# Patient Record
Sex: Female | Born: 1970
Health system: Southern US, Community
[De-identification: ages and names within clinical notes are randomized; demographics above are authoritative.]

## PROBLEM LIST (undated history)

## (undated) DIAGNOSIS — R0600 Dyspnea, unspecified: Secondary | ICD-10-CM

## (undated) DIAGNOSIS — F419 Anxiety disorder, unspecified: Secondary | ICD-10-CM

## (undated) HISTORY — PX: ANTERIOR CRUCIATE LIGAMENT REPAIR: SHX115

## (undated) HISTORY — PX: BREAST SURGERY: SHX581

## (undated) HISTORY — DX: Anxiety disorder, unspecified: F41.9

---

## 2011-01-16 ENCOUNTER — Other Ambulatory Visit: Payer: Self-pay | Admitting: Family Medicine

## 2011-01-17 ENCOUNTER — Other Ambulatory Visit: Payer: Self-pay | Admitting: Obstetrics and Gynecology

## 2011-01-17 DIAGNOSIS — N6489 Other specified disorders of breast: Secondary | ICD-10-CM

## 2011-01-23 ENCOUNTER — Ambulatory Visit
Admission: RE | Admit: 2011-01-23 | Discharge: 2011-01-23 | Disposition: A | Discharge status: Home / Self Care | Payer: Self-pay | Source: Ambulatory Visit | Attending: Obstetrics and Gynecology | Admitting: Obstetrics and Gynecology

## 2011-01-23 DIAGNOSIS — N6489 Other specified disorders of breast: Secondary | ICD-10-CM

## 2012-04-11 ENCOUNTER — Other Ambulatory Visit: Payer: Self-pay | Admitting: Obstetrics and Gynecology

## 2012-04-11 DIAGNOSIS — Z1231 Encounter for screening mammogram for malignant neoplasm of breast: Secondary | ICD-10-CM

## 2012-05-02 ENCOUNTER — Ambulatory Visit
Admission: RE | Admit: 2012-05-02 | Discharge: 2012-05-02 | Disposition: A | Discharge status: Home / Self Care | Payer: Self-pay | Source: Ambulatory Visit | Attending: Obstetrics and Gynecology | Admitting: Obstetrics and Gynecology

## 2012-05-02 DIAGNOSIS — Z1231 Encounter for screening mammogram for malignant neoplasm of breast: Secondary | ICD-10-CM

## 2013-06-05 ENCOUNTER — Other Ambulatory Visit: Payer: Self-pay

## 2013-06-05 DIAGNOSIS — Z1231 Encounter for screening mammogram for malignant neoplasm of breast: Secondary | ICD-10-CM

## 2013-07-06 ENCOUNTER — Ambulatory Visit
Admission: RE | Admit: 2013-07-06 | Discharge: 2013-07-06 | Disposition: A | Discharge status: Home / Self Care | Payer: BC Managed Care – PPO | Source: Ambulatory Visit

## 2013-07-06 DIAGNOSIS — Z1231 Encounter for screening mammogram for malignant neoplasm of breast: Secondary | ICD-10-CM

## 2014-06-09 ENCOUNTER — Other Ambulatory Visit: Payer: Self-pay

## 2014-06-09 DIAGNOSIS — Z1231 Encounter for screening mammogram for malignant neoplasm of breast: Secondary | ICD-10-CM

## 2014-07-09 ENCOUNTER — Ambulatory Visit
Admission: RE | Admit: 2014-07-09 | Discharge: 2014-07-09 | Disposition: A | Discharge status: Home / Self Care | Payer: Managed Care, Other (non HMO) | Source: Ambulatory Visit

## 2014-07-09 ENCOUNTER — Encounter (INDEPENDENT_AMBULATORY_CARE_PROVIDER_SITE_OTHER): Payer: Self-pay

## 2014-07-09 DIAGNOSIS — Z1231 Encounter for screening mammogram for malignant neoplasm of breast: Secondary | ICD-10-CM

## 2015-05-13 DIAGNOSIS — F419 Anxiety disorder, unspecified: Secondary | ICD-10-CM | POA: Insufficient documentation

## 2015-05-19 ENCOUNTER — Encounter: Payer: Self-pay | Admitting: Family Medicine

## 2015-05-19 ENCOUNTER — Ambulatory Visit (INDEPENDENT_AMBULATORY_CARE_PROVIDER_SITE_OTHER): Payer: Managed Care, Other (non HMO) | Admitting: Family Medicine

## 2015-05-19 VITALS — BP 110/78 | HR 91 | Temp 99.1°F | Ht 65.0 in | Wt 157.4 lb

## 2015-05-19 DIAGNOSIS — R1902 Left upper quadrant abdominal swelling, mass and lump: Secondary | ICD-10-CM

## 2015-05-19 DIAGNOSIS — B349 Viral infection, unspecified: Secondary | ICD-10-CM

## 2015-05-19 NOTE — Progress Notes (Signed)
BP 110/78 mmHg  Pulse 91  Temp(Src) 99.1 F (37.3 C)  Ht  (1.651 m)  Wt 157 lb 6.4 oz (71.396 kg)  BMI 26.19 kg/m2  SpO2 98%   Subjective:    Patient ID: Jenna James, female    DOB: 06-Jan-1971, 44 y.o.   MRN: [id number]  HPI: Jenna James is a 44 y.o. female  Chief Complaint  Patient presents with  . Mass    Left side and near ribs.   . Nasal Congestion  . Cough  . Ear Pain  . Adenopathy   She has a lump under her left breast, it started out really small and has been growing; she went to gynecologist for her yearly in December; he thought it was just a fatty tumor; however, she says that it has gotten bigger; feels it when bending forward or when stretching; feels pressure when eating a lot; no redness, no drainage; not pain when just sitting there; nothing similar for patient; mother and grandmother have had fatty tumors  She has been sick, declined to have flu testing done today; she was out sick all day yesterday; she got tired and body aches, maybe Monday night first symptoms, Tuesday morning was okay, then worse again Tuesday night; both ears are bothering her; lymph node swelled up on the left side; no sore throat; cough, wheezing with cough or forced expiration; back hurts  Quit running because of ankle pain  Relevant past medical, surgical, family and social history reviewed and updated as indicated. Interim medical history since our last visit reviewed. Allergies and medications reviewed and updated.  Review of Systems Per HPI unless specifically indicated above     Objective:    BP 110/78 mmHg  Pulse 91  Temp(Src) 99.1 F (37.3 C)  Ht  (1.651 m)  Wt 157 lb 6.4 oz (71.396 kg)  BMI 26.19 kg/m2  SpO2 98%  Wt Readings from Last 3 Encounters:  05/19/15 157 lb 6.4 oz (71.396 kg)  03/05/14 148 lb (67.132 kg)    Physical Exam  Constitutional: She appears well-developed and well-nourished.  Non-toxic appearance. She does not have a sickly appearance.  She does not appear ill. No distress.  Wearing a mask  HENT:  Right Ear: Hearing, external ear and ear canal normal. Tympanic membrane is injected (very mild). Tympanic membrane is not retracted. No middle ear effusion.  Left Ear: Hearing, external ear and ear canal normal. Tympanic membrane is injected (very mild). Tympanic membrane is not retracted.  No middle ear effusion.  Nose: Mucosal edema (very mild erythema and edema, slight swelling of right turbinates), rhinorrhea (scant cloudy rhinorrhea) and septal deviation (right side narrowed) present. Right sinus exhibits no maxillary sinus tenderness. Left sinus exhibits no maxillary sinus tenderness.  Mouth/Throat: Mucous membranes are normal. No oropharyngeal exudate, posterior oropharyngeal edema or posterior oropharyngeal erythema.  Eyes: EOM are normal. Right eye exhibits no exudate. Left eye exhibits no exudate. Right conjunctiva is not injected. Left conjunctiva is not injected. No scleral icterus.  Neck: No thyromegaly present.  Cardiovascular: Normal rate and regular rhythm.   Pulmonary/Chest: Effort normal and breath sounds normal. She has no wheezes. She has no rhonchi.  Abdominal: Soft. There is no hepatomegaly. There is no tenderness. There is no guarding.  Along the left upper quadrant over the ribs but below the left breast, there is an irregular firm area of swelling with overlying skin discoloration; no peau d'orange changes, just dusky discoloration  Lymphadenopathy:  Head (right side): No submandibular adenopathy present.       Head (left side): No submandibular adenopathy present.    She has cervical adenopathy.       Right cervical: No superficial cervical, no deep cervical and no posterior cervical adenopathy present.      Left cervical: Posterior cervical (shoddy) adenopathy present. No superficial cervical and no deep cervical adenopathy present.  Skin: She is not diaphoretic. No pallor.  Dark discoloration, almost  the color of a faint bruise, over the firm mass on the left upper quadrant of abdomen  Psychiatric: She has a normal mood and affect. Her behavior is normal.   No results found for this or any previous visit.    Assessment & Plan:   Problem List Items Addressed This Visit      Other   Abdominal wall mass of left upper quadrant - Primary    This does not feel like a lipoma and has overlying skin changes; will start with getting an ultrasound of the area and refer her to a surgeon      Relevant Orders   Ambulatory referral to General Surgery   US Misc Soft Tissue    Other Visit Diagnoses    Viral syndrome        should resolve on its own with symptomatic care; see AVS; if still sick in a week, consider mono or CMV; call if any problems       Follow up plan: No Follow-up on file.  Orders Placed This Encounter  Procedures  . US Misc Soft Tissue  . Ambulatory referral to General Surgery   An after-visit summary was printed and given to the patient at check-out.  Please see the patient instructions which may contain other information and recommendations beyond what is mentioned above in the assessment and plan.

## 2015-05-19 NOTE — Patient Instructions (Addendum)
Try vitamin C (orange juice if not diabetic or vitamin C tablets) and drink green tea to help your immune system during your illness Get plenty of rest and hydration If you feel worse and still sick in a week, this may be mono or CMV, and those are just treated symptomatically with rest and hydration We'll refer you to the general surgeon, and get an ultrasound If you have not heard anything from my staff in a week about any orders/referrals/studies from today, please contact us here to follow-up [phone] Consider getting a flu shot when you are feeling better in the next few weeks Return for a complete physical with Elnita MaxwellCheryl or the provider of your choice in the next few months

## 2015-05-19 NOTE — Assessment & Plan Note (Signed)
This does not feel like a lipoma and has overlying skin changes; will start with getting an ultrasound of the area and refer her to a surgeon

## 2015-05-24 ENCOUNTER — Encounter: Payer: Self-pay | Admitting: *Deleted

## 2015-05-26 ENCOUNTER — Telehealth: Payer: Self-pay | Admitting: Family Medicine

## 2015-05-26 DIAGNOSIS — R1902 Left upper quadrant abdominal swelling, mass and lump: Secondary | ICD-10-CM

## 2015-05-26 NOTE — Assessment & Plan Note (Signed)
New order for US per radiology

## 2015-05-26 NOTE — Telephone Encounter (Signed)
Please change order to Ultrasound limited abdomen per scheduling department

## 2015-05-31 ENCOUNTER — Other Ambulatory Visit: Payer: Self-pay

## 2015-05-31 DIAGNOSIS — Z1231 Encounter for screening mammogram for malignant neoplasm of breast: Secondary | ICD-10-CM

## 2015-06-02 ENCOUNTER — Ambulatory Visit: Payer: Self-pay | Admitting: General Surgery

## 2015-06-07 ENCOUNTER — Ambulatory Visit
Admission: RE | Admit: 2015-06-07 | Discharge: 2015-06-07 | Disposition: A | Discharge status: Home / Self Care | Payer: Managed Care, Other (non HMO) | Source: Ambulatory Visit | Attending: Family Medicine | Admitting: Family Medicine

## 2015-06-07 ENCOUNTER — Other Ambulatory Visit: Payer: Self-pay | Admitting: Family Medicine

## 2015-06-07 ENCOUNTER — Encounter (INDEPENDENT_AMBULATORY_CARE_PROVIDER_SITE_OTHER): Payer: Self-pay

## 2015-06-07 ENCOUNTER — Ambulatory Visit: Payer: Managed Care, Other (non HMO)

## 2015-06-07 DIAGNOSIS — R1902 Left upper quadrant abdominal swelling, mass and lump: Secondary | ICD-10-CM

## 2015-06-08 ENCOUNTER — Telehealth: Payer: Self-pay | Admitting: Family Medicine

## 2015-06-08 NOTE — Telephone Encounter (Signed)
Please let her know that her US just showed a pocket of fat- nothing to worry about!

## 2015-06-08 NOTE — Telephone Encounter (Signed)
Patient notified

## 2015-06-13 ENCOUNTER — Ambulatory Visit (INDEPENDENT_AMBULATORY_CARE_PROVIDER_SITE_OTHER): Payer: Managed Care, Other (non HMO) | Admitting: General Surgery

## 2015-06-13 ENCOUNTER — Encounter: Payer: Self-pay | Admitting: General Surgery

## 2015-06-13 VITALS — BP 116/68 | HR 78 | Resp 12 | Ht 65.0 in | Wt 171.0 lb

## 2015-06-13 DIAGNOSIS — R1902 Left upper quadrant abdominal swelling, mass and lump: Secondary | ICD-10-CM

## 2015-06-13 NOTE — Progress Notes (Signed)
Patient ID: Jenna James, female   DOB: October 29, 1970, 44 y.o.   MRN: [id number]  Chief Complaint  Patient presents with  . Other    Lump on ribs    HPI Jenna James is a 44 y.o. female here today for a evaluation of an abdomen wall mass in her upper left quadrant . Patient noticed this about a year ago, she states the area has got bigger.Tender to touch. The patient reports that the area began as the size about that of the tip of her tongue and is now the size of her hand. She describes early satiety with meals. No nausea, vomiting, change in bowel habits.Ultrasound done on 06/07/15.  HPI  Past Medical History  Diagnosis Date  . Anxiety     Past Surgical History  Procedure Laterality Date  . Anterior cruciate ligament repair Left     left knee    Family History  Problem Relation Age of Onset  . Breast cancer Maternal Grandmother   . Colon cancer Maternal Aunt     Social History Social History  Substance Use Topics  . Smoking status: Never Smoker   . Smokeless tobacco: Never Used  . Alcohol Use: 0.6 - 1.2 oz/week    1-2 Standard drinks or equivalent per week    Allergies  Allergen Reactions  . Celexa [Citalopram]     Current Outpatient Prescriptions  Medication Sig Dispense Refill  . clonazePAM (KLONOPIN) 0.5 MG tablet Take 0.5 mg by mouth at bedtime as needed for anxiety.    . diclofenac (VOLTAREN) 75 MG EC tablet Take 75 mg by mouth 2 (two) times daily.     No current facility-administered medications for this visit.    Review of Systems Review of Systems  Constitutional: Negative.   HENT: Negative.   Eyes: Negative.   Respiratory: Negative.   Endocrine: Negative.   Genitourinary: Negative.     Blood pressure 116/68, pulse 78, resp. rate 12, height _0  (1.651 m), weight 171 lb (77.565 kg).  Physical Exam Physical Exam  Constitutional: She is oriented to person, place, and time. She appears well-developed and well-nourished.  Eyes: Conjunctivae are normal.  No scleral icterus.  Neck: Neck supple.  Cardiovascular: Normal rate, regular rhythm and normal heart sounds.   Pulmonary/Chest: Effort normal and breath sounds normal.    Abdominal: Soft. Normal appearance and bowel sounds are normal.  Brownish color area at mcl 4 cm above the costal margins.   Lymphadenopathy:    She has no cervical adenopathy.  Neurological: She is alert and oriented to person, place, and time.  Skin: Skin is warm and dry.    Data Reviewed Ultrasound of the soft tissues of the upper abdomen completed 06/07/2015 were reviewed. No distinct ultrasound abnormality.  Assessment    Abnormal exam of the upper abdominal wall, history early satiety.     Plan    It's difficult to correlate the abdominal wall findings with the report of early satiety. The patient reports increased symptoms 1 bending over to put on socks or tie her shoes suggesting a fullness not entirely accounted for by the modest soft tissue thickening on the anterior abdominal wall. A CT scan has been recommended to help clarify if a mass or inflammatory processes present. The patient is aware that I will review the films prior to calling the report.      Patient is scheduled for a CT at [facility] on 06/17/15 at 3:30 pm. She will pick up a  prep kit today. She is to arrive at 3:15 pm and have nothing to eat or drink for 4 hours prior. Patient is aware of date, time, and instructions.   PCP:  Fleet Contras 06/13/2015, 8:29 PM

## 2015-06-13 NOTE — Patient Instructions (Addendum)
Follow up appointment to be announced.  Patient is scheduled for a CT at [facility] on 06/17/15 at 3:30 pm. She will pick up a prep kit today. She is to arrive at 3:15 pm and have nothing to eat or drink for 4 hours prior. Patient is aware of date, time, and instructions.  

## 2015-06-14 ENCOUNTER — Telehealth: Payer: Self-pay | Admitting: *Deleted

## 2015-06-14 NOTE — Telephone Encounter (Signed)
Patient called to state she wants her CT done with [facility] [place], which it would be cheaper with her insurance. Needs an order sent for CT. [facility] [phone]/option 1

## 2015-06-15 ENCOUNTER — Other Ambulatory Visit: Payer: Self-pay

## 2015-06-15 DIAGNOSIS — R1902 Left upper quadrant abdominal swelling, mass and lump: Secondary | ICD-10-CM

## 2015-06-15 NOTE — Telephone Encounter (Signed)
Spoke with [facility] and the order and patient information has been sent to them. They will contact the patient to schedule this. I let the patient know to contact us once this is scheduled so we may make sure she has authorization with her insurance. Patient understands.

## 2015-06-17 ENCOUNTER — Ambulatory Visit: Payer: Managed Care, Other (non HMO)

## 2015-06-20 ENCOUNTER — Telehealth: Payer: Self-pay | Admitting: Family Medicine

## 2015-06-20 NOTE — Telephone Encounter (Signed)
I spoke with patient, she is seeing surgeon, has CT scan coming up; I explained I just wanted to make sure US and issue were being managed; I was out of town when her results came back, so just following up and being thorough

## 2015-06-22 ENCOUNTER — Telehealth: Payer: Self-pay | Admitting: General Surgery

## 2015-06-22 ENCOUNTER — Telehealth: Payer: Self-pay | Admitting: *Deleted

## 2015-06-22 NOTE — Telephone Encounter (Signed)
-----   Message from Earline MayotteJeffrey W Byrnett, MD sent at 06/22/2015  4:28 PM EST ----- CT was done at an outside facility. Report only notes prominence of the left hepatic lobe. Anatomic variant. No abnormality of the abdominal wall reported.  We'll be glad to review the films if the patient has a CD sent for review.

## 2015-06-22 NOTE — Telephone Encounter (Signed)
PT CALLED & WOULD LIKE FOR YOU TO CALL HER C# ONCE YOU GET HER CT RESULTS

## 2015-06-27 NOTE — Telephone Encounter (Signed)
Patient called back on Wednesday afternoon and left a message with the answering service returning Marsha's call.   An attempt was made to reach patient today but she did not answer. Message was left for patient to call the office.

## 2015-06-27 NOTE — Telephone Encounter (Signed)
Patient called back and was notified as instructed.   This patient states she does have a copy of the CD.

## 2015-06-28 ENCOUNTER — Encounter: Payer: Self-pay | Admitting: General Surgery

## 2015-06-28 ENCOUNTER — Telehealth: Payer: Self-pay

## 2015-06-28 NOTE — Telephone Encounter (Signed)
Notified patient as instructed, discussed follow-up appointments, patient agrees. Will follow up here with Dr Lemar LivingsByrnett on 07/05/15 at 4:00 pm.

## 2015-06-28 NOTE — Telephone Encounter (Signed)
-----   Message from Earline MayotteJeffrey W Byrnett, MD sent at 06/28/2015 11:13 AM EST ----- Please notify patient I reviewed her CT. I would like her to return for a f/u exam.  Thanks.

## 2015-06-28 NOTE — Progress Notes (Addendum)
06/22/2015 CT scan [facility] at [facility] was reviewed.  The study was completed to assess the patient's report of a mass effect in the left upper quadrant. Previous ultrasound had been reported as negative, with the suggestion that the mass effect could be secondary to a lipoma-like fatty deposit. CT report suggested prominence of the left hepatic lobe.  On review of the films left hepatic lobe does extend all the way to the splenic hilum. This is below the rib cage and would be unlikely to be sensed by palpation. There is no prominence of the adipose tissue on the left lower chest/upper abdomen when compared to the right side.  There seems to be asymmetry in the muscles at this level accounting for the reported prominence, although a distinct mass effect is not seen. Neither coronal nor sagittal sections were submitted, only axial images available for review.  The patient will be asked to return for reassessment and possible repeat ultrasound with focus on the muscles rounded in the adipose tissue.

## 2015-06-29 ENCOUNTER — Telehealth: Payer: Self-pay

## 2015-06-29 DIAGNOSIS — Z0000 Encounter for general adult medical examination without abnormal findings: Secondary | ICD-10-CM

## 2015-06-29 DIAGNOSIS — Z205 Contact with and (suspected) exposure to viral hepatitis: Secondary | ICD-10-CM

## 2015-06-29 NOTE — Telephone Encounter (Signed)
Patient called and stated that we referred her to general surgery, they did an u/s and sent her for a CT and now they want to see her back to discuss the results. She said he mentioned he suspected she has inflammation of the liver. She is worried because her husband is from [place] and gave blood while he was there. They told him that he had to stop because he had Hep C Antibiodies or Antigens. She wants to know if this could possibly be transmitted to her and causing her issues?

## 2015-06-30 DIAGNOSIS — Z0000 Encounter for general adult medical examination without abnormal findings: Secondary | ICD-10-CM | POA: Insufficient documentation

## 2015-06-30 DIAGNOSIS — Z205 Contact with and (suspected) exposure to viral hepatitis: Secondary | ICD-10-CM | POA: Insufficient documentation

## 2015-06-30 NOTE — Telephone Encounter (Signed)
I reviewed the note below Her husband moved from [place] She wondered if getting labs would be a good idea We'll get labs; she can have them done here OR at Dr. Rutherford NailByrnett's office or other draw station; she'll check insurance to see if co-pay Will start with very basic (CMET, hep panel) and other routine screening labs Further labs based on first set of tests

## 2015-07-05 ENCOUNTER — Other Ambulatory Visit: Payer: Managed Care, Other (non HMO)

## 2015-07-05 ENCOUNTER — Ambulatory Visit (INDEPENDENT_AMBULATORY_CARE_PROVIDER_SITE_OTHER): Payer: Managed Care, Other (non HMO) | Admitting: General Surgery

## 2015-07-05 ENCOUNTER — Encounter: Payer: Self-pay | Admitting: General Surgery

## 2015-07-05 VITALS — BP 120/70 | HR 72 | Resp 12 | Ht 65.0 in | Wt 159.0 lb

## 2015-07-05 DIAGNOSIS — R1902 Left upper quadrant abdominal swelling, mass and lump: Secondary | ICD-10-CM

## 2015-07-05 NOTE — Progress Notes (Signed)
Patient ID: Jenna James, female   DOB: 1971-05-16, 44 y.o.   MRN: [id number]  Chief Complaint  Patient presents with  . Follow-up    abdominal wasll mass results    HPI Jenna James is a 44 y.o. female here today abdominal wasll mass and CT results that was completed on 06/15/15. She states the pain is still the same, comes and goes. After her last viist the pain was worse for 2-3 days. She does described the pain as a mild discomfort but some days it may throb. She does appreciate the pain when she bends down to tie her shoes.  The patient thought that the underwire from her bra was traumatizing this area and when she assumed the posterior that she uses sitting at the computer desk the areas some motion of the broad down to this area, but this is unlikely the sole source. She does admit to tripping and falling on the right upper abdomen about 6 years ago. Since that time she's had intermittent right-sided discomfort, which was not mentioned during her last visit. This is unchanged over several years.   Minimal joint discomfort but she is riding a mountain bike.  She runs about 2 miles a day.  I person reviewed the above history.  HPI  Past Medical History  Diagnosis Date  . Anxiety     Past Surgical History  Procedure Laterality Date  . Anterior cruciate ligament repair Left     left knee    Family History  Problem Relation Age of Onset  . Breast cancer Maternal Grandmother   . Colon cancer Maternal Aunt     Social History Social History  Substance Use Topics  . Smoking status: Never Smoker   . Smokeless tobacco: Never Used  . Alcohol Use: 0.6 - 1.2 oz/week    1-2 Standard drinks or equivalent per week    Allergies  Allergen Reactions  . Celexa [Citalopram]     Current Outpatient Prescriptions  Medication Sig Dispense Refill  . clonazePAM (KLONOPIN) 0.5 MG tablet Take 0.5 mg by mouth at bedtime as needed for anxiety.     No current facility-administered  medications for this visit.    Review of Systems Review of Systems  Constitutional: Negative.   Respiratory: Negative.   Cardiovascular: Negative.   Gastrointestinal: Positive for abdominal pain. Negative for nausea, vomiting, diarrhea and constipation.    Blood pressure 120/70, pulse 72, resp. rate 12, height 5\' 5"  (1.651 m), weight 159 lb (72.122 kg).  Physical Exam Physical Exam  Constitutional: She is oriented to person, place, and time. She appears well-developed and well-nourished.  HENT:  Mouth/Throat: Oropharynx is clear and moist.  Eyes: Conjunctivae are normal. No scleral icterus.  Neck: Neck supple.  Cardiovascular: Normal rate, regular rhythm and normal heart sounds.   Pulmonary/Chest: Effort normal and breath sounds normal.    Abdominal: Normal appearance.  9 x 15 thickening center of right rib cage at axillary line  Lymphadenopathy:    She has no cervical adenopathy.  Neurological: She is alert and oriented to person, place, and time.  Skin: Skin is warm and dry.  Psychiatric: Her behavior is normal.    Data Reviewed Outside CT from [facility] dated 06/15/2015 reviewed. They described the left lobe of liver extending to the splenic hilum. This would not be a palpable finding nor doesn't account for the clinically evident changes in the skin. Independent review of the films suggested some asymmetric volume of the intercostal muscles.  Ultrasound  examination of the skin involving the lower chest wall was completed due to the asymmetric exam. There is slight prominence of the subcutaneous tissue and more pronounced prominence of the dermis on the left lower chest wall where the area of clinical thickening and skin discoloration is evident. The skin measures up to 0.32 cm in this area, below this where no visible disc abnormality is appreciated by visual inspection or palpation this only measures 0.25 cm. Node clearly identifiable mass in the subtenon's tissue is  appreciated. No vascular abnormality.  Examination of the contralateral lower chest wall skin shows a thickness of 0.26 cm, similar to that in the uninvolved skin on the left side of the abdomen. This study does not clearly elucidate a process accounting for the visible findings.  Assessment    Atypical skin of the chest wall, unlikely to be purely related to trauma from her underwire bra.     Plan    This may be an dermal manifestation of a systemic process, and before biopsy is undertaken her recommended formal evaluation with dermatology would be appropriate.   Her daughter has gone to [facility], and she was encouraged to seek evaluation through their office of this process. With her permission, copy of today's notes will be forwarded to them.     Referral to dermatology.  PCP:  Thalia Bloodgood 07/06/2015, 4:08 PM

## 2015-07-05 NOTE — Patient Instructions (Addendum)
The patient is aware to call back for any questions or concerns. Dermatology appointment

## 2015-07-11 ENCOUNTER — Ambulatory Visit
Admission: RE | Admit: 2015-07-11 | Discharge: 2015-07-11 | Disposition: A | Discharge status: Home / Self Care | Payer: Managed Care, Other (non HMO) | Source: Ambulatory Visit

## 2015-07-11 DIAGNOSIS — Z1231 Encounter for screening mammogram for malignant neoplasm of breast: Secondary | ICD-10-CM

## 2015-07-14 ENCOUNTER — Other Ambulatory Visit: Payer: Self-pay | Admitting: Obstetrics and Gynecology

## 2015-07-14 DIAGNOSIS — R928 Other abnormal and inconclusive findings on diagnostic imaging of breast: Secondary | ICD-10-CM

## 2015-08-11 ENCOUNTER — Ambulatory Visit
Admission: RE | Admit: 2015-08-11 | Discharge: 2015-08-11 | Disposition: A | Discharge status: Home / Self Care | Payer: Managed Care, Other (non HMO) | Source: Ambulatory Visit | Attending: Obstetrics and Gynecology | Admitting: Obstetrics and Gynecology

## 2015-08-11 DIAGNOSIS — R928 Other abnormal and inconclusive findings on diagnostic imaging of breast: Secondary | ICD-10-CM

## 2015-09-19 ENCOUNTER — Other Ambulatory Visit: Payer: Self-pay | Admitting: Unknown Physician Specialty

## 2015-09-26 ENCOUNTER — Other Ambulatory Visit: Payer: Self-pay

## 2015-09-26 NOTE — Telephone Encounter (Signed)
Routing to provider. She'd like a refill on Clonazepam, she states she had refills but they expire before she uses them.

## 2015-09-27 NOTE — Telephone Encounter (Signed)
OK to wait for ML due to PRN med

## 2015-09-28 NOTE — Telephone Encounter (Signed)
Please let Jalesia Loudenslager know that I'd like to see patient for an appointment here in the office for:  Requesting a controlled substance Please schedule a visit with me  in the next: few weeks Thank you, Dr. Sherie Don

## 2015-09-29 NOTE — Telephone Encounter (Signed)
Called patient but no answer therefore left a vm to call us back and schedule a f/u for her controlled substance, thanks.

## 2015-10-06 ENCOUNTER — Encounter: Payer: Self-pay | Admitting: Family Medicine

## 2015-10-06 NOTE — Telephone Encounter (Signed)
Sent letter home 10/06/15 °

## 2015-10-17 ENCOUNTER — Telehealth: Payer: Self-pay | Admitting: Family Medicine

## 2015-10-17 NOTE — Telephone Encounter (Signed)
Left detailed message to come in for fasting labs.

## 2015-10-17 NOTE — Telephone Encounter (Signed)
Please remind patient she has outstanding labs to be drawn; ordered in December; thank you

## 2016-03-02 ENCOUNTER — Other Ambulatory Visit: Payer: Self-pay | Admitting: Obstetrics and Gynecology

## 2016-03-02 DIAGNOSIS — N6310 Unspecified lump in the right breast, unspecified quadrant: Secondary | ICD-10-CM

## 2016-03-12 ENCOUNTER — Ambulatory Visit
Admission: RE | Admit: 2016-03-12 | Discharge: 2016-03-12 | Disposition: A | Discharge status: Home / Self Care | Payer: Managed Care, Other (non HMO) | Source: Ambulatory Visit | Attending: Obstetrics and Gynecology | Admitting: Obstetrics and Gynecology

## 2016-03-12 DIAGNOSIS — N6310 Unspecified lump in the right breast, unspecified quadrant: Secondary | ICD-10-CM

## 2016-03-16 ENCOUNTER — Other Ambulatory Visit: Payer: Self-pay | Admitting: Obstetrics and Gynecology

## 2016-03-16 DIAGNOSIS — N63 Unspecified lump in unspecified breast: Secondary | ICD-10-CM

## 2016-03-21 ENCOUNTER — Other Ambulatory Visit: Payer: Self-pay | Admitting: Obstetrics and Gynecology

## 2016-03-21 DIAGNOSIS — N63 Unspecified lump in unspecified breast: Secondary | ICD-10-CM

## 2016-04-12 ENCOUNTER — Encounter: Payer: Self-pay | Admitting: Family Medicine

## 2016-04-12 DIAGNOSIS — S8262XA Displaced fracture of lateral malleolus of left fibula, initial encounter for closed fracture: Secondary | ICD-10-CM | POA: Insufficient documentation

## 2016-09-13 ENCOUNTER — Ambulatory Visit
Admission: RE | Admit: 2016-09-13 | Discharge: 2016-09-13 | Disposition: A | Discharge status: Home / Self Care | Payer: Managed Care, Other (non HMO) | Source: Ambulatory Visit | Attending: Obstetrics and Gynecology | Admitting: Obstetrics and Gynecology

## 2016-09-13 DIAGNOSIS — N63 Unspecified lump in unspecified breast: Secondary | ICD-10-CM

## 2016-10-11 ENCOUNTER — Other Ambulatory Visit: Payer: Managed Care, Other (non HMO)

## 2016-10-11 ENCOUNTER — Ambulatory Visit: Payer: Managed Care, Other (non HMO)

## 2016-11-27 ENCOUNTER — Other Ambulatory Visit: Payer: Self-pay

## 2016-11-27 ENCOUNTER — Other Ambulatory Visit: Payer: Self-pay | Admitting: Unknown Physician Specialty

## 2016-11-27 NOTE — Telephone Encounter (Signed)
Patient has not been seen in over a year; rx request denied

## 2016-12-13 ENCOUNTER — Telehealth: Payer: Self-pay | Admitting: Unknown Physician Specialty

## 2016-12-13 NOTE — Telephone Encounter (Signed)
Printed and placed at front desk. Pt only has TD, and Tdaps on file here however.

## 2016-12-13 NOTE — Telephone Encounter (Signed)
Called and left a voicemail on patient's phone that we have her shot records ready.

## 2016-12-13 NOTE — Telephone Encounter (Signed)
Patient called to request a copy of her shot records because she is going out of town tomorrow 12/15/2015. Patient asked if she could receive a call when they are ready to  Come in and pick them up.  Please Advise.  Thank you

## 2016-12-14 ENCOUNTER — Telehealth: Payer: Self-pay | Admitting: Unknown Physician Specialty

## 2016-12-14 NOTE — Telephone Encounter (Signed)
error 

## 2016-12-14 NOTE — Telephone Encounter (Signed)
Patient stopped by and picked up shot records that showed available shot history.

## 2016-12-17 ENCOUNTER — Telehealth: Payer: Self-pay | Admitting: Family Medicine

## 2016-12-17 NOTE — Telephone Encounter (Signed)
Patient calling to get doctors orders for a travel vaccine. Patient states that she is going to [place]. Patient would also like for travel vaccine to be faxed to her home phone number at [phone].  Please Advise.  Thank you

## 2016-12-18 NOTE — Telephone Encounter (Signed)
We do not handle travel vaccines. Pt will need to contact health dept and/or a travel clinic such as [facility].

## 2016-12-18 NOTE — Telephone Encounter (Signed)
Referral not needed.

## 2017-01-21 ENCOUNTER — Ambulatory Visit (INDEPENDENT_AMBULATORY_CARE_PROVIDER_SITE_OTHER): Payer: Managed Care, Other (non HMO)

## 2017-01-21 DIAGNOSIS — Z23 Encounter for immunization: Secondary | ICD-10-CM

## 2017-03-13 ENCOUNTER — Encounter: Payer: Self-pay | Admitting: Family Medicine

## 2017-03-13 ENCOUNTER — Ambulatory Visit (INDEPENDENT_AMBULATORY_CARE_PROVIDER_SITE_OTHER): Payer: Managed Care, Other (non HMO) | Admitting: Family Medicine

## 2017-03-13 VITALS — BP 116/74 | HR 61 | Wt 147.0 lb

## 2017-03-13 DIAGNOSIS — J392 Other diseases of pharynx: Secondary | ICD-10-CM

## 2017-03-13 DIAGNOSIS — R42 Dizziness and giddiness: Secondary | ICD-10-CM

## 2017-03-13 DIAGNOSIS — R221 Localized swelling, mass and lump, neck: Secondary | ICD-10-CM

## 2017-03-13 DIAGNOSIS — F419 Anxiety disorder, unspecified: Secondary | ICD-10-CM | POA: Diagnosis not present

## 2017-03-13 MED ORDER — MECLIZINE HCL 25 MG PO TABS: 25.0000 mg | ORAL_TABLET | Freq: Three times a day (TID) | ORAL | 0 refills | Status: DC | PRN

## 2017-03-13 MED ORDER — CLONAZEPAM 0.5 MG PO TABS: 0.5000 mg | ORAL_TABLET | Freq: Every evening | ORAL | 0 refills | Status: DC | PRN

## 2017-03-13 NOTE — Patient Instructions (Addendum)
Meclizine as need for dizziness.   How to Perform the Epley Maneuver The Epley maneuver is an exercise that relieves symptoms of vertigo. Vertigo is the feeling that you or your surroundings are moving when they are not. When you feel vertigo, you may feel like the room is spinning and have trouble walking. Dizziness is a little different than vertigo. When you are dizzy, you may feel unsteady or light-headed. You can do this maneuver at home whenever you have symptoms of vertigo. You can do it up to 3 times a day until your symptoms go away. Even though the Epley maneuver may relieve your vertigo for a few weeks, it is possible that your symptoms will return. This maneuver relieves vertigo, but it does not relieve dizziness. What are the risks? If it is done correctly, the Epley maneuver is considered safe. Sometimes it can lead to dizziness or nausea that goes away after a short time. If you develop other symptoms, such as changes in vision, weakness, or numbness, stop doing the maneuver and call your health care provider. How to perform the Epley maneuver 1. Sit on the edge of a bed or table with your back straight and your legs extended or hanging over the edge of the bed or table. 2. Turn your head halfway toward the affected ear or side. 3. Lie backward quickly with your head turned until you are lying flat on your back. You may want to position a pillow under your shoulders. 4. Hold this position for 30 seconds. You may experience an attack of vertigo. This is normal. 5. Turn your head to the opposite direction until your unaffected ear is facing the floor. 6. Hold this position for 30 seconds. You may experience an attack of vertigo. This is normal. Hold this position until the vertigo stops. 7. Turn your whole body to the same side as your head. Hold for another 30 seconds. 8. Sit back up. You can repeat this exercise up to 3 times a day. Follow these instructions at home:  After doing  the Epley maneuver, you can return to your normal activities.  Ask your health care provider if there is anything you should do at home to prevent vertigo. He or she may recommend that you: ? Keep your head raised (elevated) with two or more pillows while you sleep. ? Do not sleep on the side of your affected ear. ? Get up slowly from bed. ? Avoid sudden movements during the day. ? Avoid extreme head movement, like looking up or bending over. Contact a health care provider if:  Your vertigo gets worse.  You have other symptoms, including: ? Nausea. ? Vomiting. ? Headache. Get help right away if:  You have vision changes.  You have a severe or worsening headache or neck pain.  You cannot stop vomiting.  You have new numbness or weakness in any part of your body. Summary  Vertigo is the feeling that you or your surroundings are moving when they are not.  The Epley maneuver is an exercise that relieves symptoms of vertigo.  If the Epley maneuver is done correctly, it is considered safe. You can do it up to 3 times a day. This information is not intended to replace advice given to you by your health care provider. Make sure you discuss any questions you have with your health care provider. Document Released: 07/21/2013 Document Revised: 06/05/2016 Document Reviewed: 06/05/2016 Elsevier Interactive Patient Education  2017 [employer]

## 2017-03-13 NOTE — Assessment & Plan Note (Signed)
Klonopin refilled today, continue taking 1/4 tab QHS prn. Precautions reviewed.

## 2017-03-13 NOTE — Progress Notes (Signed)
BP 116/74   Pulse 61   Wt 147 lb (66.7 kg)   SpO2 97%   BMI 24.46 kg/m    Subjective:    Patient ID: Jenna James, female    DOB: 01-Jun-1971, 46 y.o.   MRN: [id number]  HPI: Jenna James is a 46 y.o. female  Chief Complaint  Patient presents with  . Dizziness    started Sunday, feels off balance especially if she moves her head.  . Mass    white nodule in the back of her throat, has gotten bigger over the last year  . Medication Refill    she needs a refill on Klonopin.   Patient presents with several days of short episodes of dizziness that seem to be brought on by moving her head. Sxs last several seconds, then dissipate. Denies N/V, syncope, CP, palpitations, diaphoresis. Drinking plenty of water.   Needing klonopin refill for her anxiety. Taking 1/4 pill about 3/week, has been taking a bit more lately than she usually does due to work and personal life stress. Last refilled with Dr. Hyacinth Meeker when she broke her ankle last year. Works well, no side effects noted.   Also has been monitoring a small white lump near her tonsils. Maybe a small amount of growth over the past year. No pain, dysphagia, weight loss, fevers.   Sees GYN for annual physical. UTD.   Past Medical History:  Diagnosis Date  . Anxiety    Social History   Social History  . Marital status: Married    Spouse name: N/A  . Number of children: N/A  . Years of education: N/A   Occupational History  . Not on file.   Social History Main Topics  . Smoking status: Never Smoker  . Smokeless tobacco: Never Used  . Alcohol use 0.6 - 1.2 oz/week    1 - 2 Standard drinks or equivalent per week  . Drug use: No  . Sexual activity: Not on file   Other Topics Concern  . Not on file   Social History Narrative  . No narrative on file   Relevant past medical, surgical, family and social history reviewed and updated as indicated. Interim medical history since our last visit reviewed. Allergies and medications  reviewed and updated.  Review of Systems  Constitutional: Negative.   HENT: Negative.   Respiratory: Negative.   Cardiovascular: Negative.   Gastrointestinal: Negative.   Musculoskeletal: Negative.   Neurological: Positive for dizziness.  Psychiatric/Behavioral: Negative.    Per HPI unless specifically indicated above     Objective:    BP 116/74   Pulse 61   Wt 147 lb (66.7 kg)   SpO2 97%   BMI 24.46 kg/m   Wt Readings from Last 3 Encounters:  03/13/17 147 lb (66.7 kg)  07/05/15 159 lb (72.1 kg)  06/13/15 171 lb (77.6 kg)    Physical Exam  Constitutional: She is oriented to person, place, and time. She appears well-developed and well-nourished. No distress.  HENT:  Head: Atraumatic.  Right Ear: External ear normal.  Left Ear: External ear normal.  Nose: Nose normal.  Mouth/Throat: Oropharynx is clear and moist.  Eyes: Pupils are equal, round, and reactive to light. Conjunctivae are normal.  Neck: Normal range of motion. Neck supple.  Cardiovascular: Normal rate and normal heart sounds.   Pulmonary/Chest: Effort normal and breath sounds normal.  Musculoskeletal: Normal range of motion.  Neurological: She is alert and oriented to person, place, and time. No cranial nerve  deficit.  Skin: Skin is warm and dry.  Psychiatric: She has a normal mood and affect. Her behavior is normal.  Nursing note and vitals reviewed.  No results found for this or any previous visit.    Assessment & Plan:   Problem List Items Addressed This Visit      Other   Anxiety    Klonopin refilled today, continue taking 1/4 tab QHS prn. Precautions reviewed.        Other Visit Diagnoses    Dizziness    -  Primary   Suspect vertigo given positional nature. Will treat with meclizine prn and epley maneuvers. F/u if no improvement   Mass of throat       Small pale lump in posterior oropharynx. Not seeming to grow or change, non-tender. F/u with ENT if noticing changes       Follow up  plan: Return if symptoms worsen or fail to improve.

## 2017-05-20 ENCOUNTER — Other Ambulatory Visit: Payer: Self-pay | Admitting: Obstetrics and Gynecology

## 2017-05-20 DIAGNOSIS — Z1231 Encounter for screening mammogram for malignant neoplasm of breast: Secondary | ICD-10-CM

## 2017-06-12 ENCOUNTER — Other Ambulatory Visit: Payer: Self-pay | Admitting: Family Medicine

## 2017-06-12 NOTE — Telephone Encounter (Signed)
Review for refill. 

## 2017-06-18 ENCOUNTER — Ambulatory Visit: Payer: Self-pay | Admitting: *Deleted

## 2017-06-18 NOTE — Telephone Encounter (Signed)
   Reason for Disposition . Ear congestion  Answer Assessment - Initial Assessment Questions 1. LOCATION: "Which ear is involved?"       Both ears 2. SENSATION: "Describe how the ear feels."      Popping sensation, sharp pain at times 3. ONSET:  "When did the ear symptoms start?"       Last week 4. PAIN: "Do you also have an earache?" If so, ask: "How bad is it?" (Scale 1-10; or mild, moderate, severe)     Pain 5, moderate intermittent pain 5. CAUSE: "What do you think is causing the ear congestion?"     Recently has had congestion for approximately a week 6. URI: "Do you have a runny nose or cough?"     Cough 7. NASAL ALLERGIES: "Are there symptoms of hay fever, such as sneezing or a clear nasal discharge?"    No fever, Chest congestion 8. PREGNANCY: "Is there any chance you are pregnant?" "When was your last menstrual period?"     No, last menstrual period years ago  Protocols used: EAR - CONGESTION-A-AH

## 2017-07-25 ENCOUNTER — Other Ambulatory Visit: Payer: Self-pay | Admitting: Family Medicine

## 2017-07-25 NOTE — Telephone Encounter (Signed)
Routing to provider. Patient last seen 03/13/17.

## 2017-07-25 NOTE — Telephone Encounter (Signed)
[facility] [place] Valt[place].  Thanks

## 2017-07-26 MED ORDER — VALACYCLOVIR HCL 1 G PO TABS: 1000.0000 mg | ORAL_TABLET | Freq: Every day | ORAL | 3 refills | Status: DC

## 2017-09-17 ENCOUNTER — Ambulatory Visit
Admission: RE | Admit: 2017-09-17 | Discharge: 2017-09-17 | Disposition: A | Discharge status: Home / Self Care | Payer: Managed Care, Other (non HMO) | Source: Ambulatory Visit | Attending: Obstetrics and Gynecology | Admitting: Obstetrics and Gynecology

## 2017-09-17 DIAGNOSIS — Z1231 Encounter for screening mammogram for malignant neoplasm of breast: Secondary | ICD-10-CM

## 2017-10-21 ENCOUNTER — Telehealth: Payer: Self-pay | Admitting: Family Medicine

## 2017-10-21 NOTE — Telephone Encounter (Signed)
Please set her up for CPE, she will need a visit before any other refills. I just printed a refill to get faxed over for now

## 2017-10-22 NOTE — Telephone Encounter (Signed)
Called patient and informed her that she will need a physical in regards to her medication refills. Patient declined to schedule appointment at the moment and stated she will call back soon to schedule physical appointment.

## 2018-01-31 ENCOUNTER — Ambulatory Visit (INDEPENDENT_AMBULATORY_CARE_PROVIDER_SITE_OTHER): Payer: Managed Care, Other (non HMO) | Admitting: Family Medicine

## 2018-01-31 ENCOUNTER — Encounter: Payer: Self-pay | Admitting: Family Medicine

## 2018-01-31 VITALS — BP 113/77 | HR 78 | Temp 98.5°F | Ht 65.5 in | Wt 170.6 lb

## 2018-01-31 DIAGNOSIS — R197 Diarrhea, unspecified: Secondary | ICD-10-CM | POA: Diagnosis not present

## 2018-01-31 DIAGNOSIS — H60501 Unspecified acute noninfective otitis externa, right ear: Secondary | ICD-10-CM

## 2018-01-31 MED ORDER — CIPROFLOXACIN-DEXAMETHASONE 0.3-0.1 % OT SUSP: 2.0000 [drp] | Freq: Three times a day (TID) | OTIC | 0 refills | Status: DC | PRN

## 2018-01-31 NOTE — Progress Notes (Addendum)
BP 113/77 (BP Location: Right Arm, Patient Position: Sitting, Cuff Size: Normal)   Pulse 78   Temp 98.5 F (36.9 C) (Oral)   Ht 5' 5.5" (1.664 m)   Wt 170 lb 9.6 oz (77.4 kg)   SpO2 97%   BMI 27.96 kg/m    Subjective:    Patient ID: Jenna James, female    DOB: 1971/06/07, 47 y.o.   MRN: [id number]  HPI: Jenna James is a 47 y.o. female  Chief Complaint  Patient presents with  . Ear Pain    Right ear. Ongoing for 1 week and progressed. Left ear became painful yesterday.   . Diarrhea    Patient went to [place] a year ago, patient states she's had stomach problems ever since.   Pt here today for 1 week of right outer ear pain and some drainage. Hurts when she touches and pulls on the ear. Has been swimming a lot this summer. Denies inner ear pressure, muffled hearing, rhinorrhea, fever, or sore throat. Not trying anything OTC for sxs.  Stomach issues for almost a year after a trip to [place]. States she has daily diarrhea ever since this trip. Has not changed her diet in any way. No abdominal pain, fevers, weight loss, melena, vomiting. Has not tried anything OTC for sxs.  Past Medical History:  Diagnosis Date  . Anxiety    Social History   Socioeconomic History  . Marital status: Married    Spouse name: Not on file  . Number of children: Not on file  . Years of education: Not on file  . Highest education level: Not on file  Occupational History  . Not on file  Social Needs  . Financial resource strain: Not on file  . Food insecurity:    Worry: Not on file    Inability: Not on file  . Transportation needs:    Medical: Not on file    Non-medical: Not on file  Tobacco Use  . Smoking status: Never Smoker  . Smokeless tobacco: Never Used  Substance and Sexual Activity  . Alcohol use: Yes    Alcohol/week: 0.6 - 1.2 oz    Types: 1 - 2 Standard drinks or equivalent per week  . Drug use: No  . Sexual activity: Not on file  Lifestyle  . Physical activity:    Days per  week: Not on file    Minutes per session: Not on file  . Stress: Not on file  Relationships  . Social connections:    Talks on phone: Not on file    Gets together: Not on file    Attends religious service: Not on file    Active member of club or organization: Not on file    Attends meetings of clubs or organizations: Not on file    Relationship status: Not on file  . Intimate partner violence:    Fear of current or ex partner: Not on file    Emotionally abused: Not on file    Physically abused: Not on file    Forced sexual activity: Not on file  Other Topics Concern  . Not on file  Social History Narrative  . Not on file   Relevant past medical, surgical, family and social history reviewed and updated as indicated. Interim medical history since our last visit reviewed. Allergies and medications reviewed and updated.  Review of Systems  Per HPI unless specifically indicated above     Objective:    BP 113/77 (BP Location:  Right Arm, Patient Position: Sitting, Cuff Size: Normal)   Pulse 78   Temp 98.5 F (36.9 C) (Oral)   Ht 5' 5.5" (1.664 m)   Wt 170 lb 9.6 oz (77.4 kg)   SpO2 97%   BMI 27.96 kg/m   Wt Readings from Last 3 Encounters:  01/31/18 170 lb 9.6 oz (77.4 kg)  03/13/17 147 lb (66.7 kg)  07/05/15 159 lb (72.1 kg)    Physical Exam  Constitutional: She is oriented to person, place, and time. She appears well-developed and well-nourished.  HENT:  Head: Atraumatic.  Nose: Nose normal.  Mouth/Throat: Oropharynx is clear and moist.  B/l EAC erythema, right worse than left  Eyes: Pupils are equal, round, and reactive to light. Conjunctivae are normal.  Neck: Normal range of motion. Neck supple.  Cardiovascular: Normal rate and regular rhythm.  Pulmonary/Chest: Effort normal and breath sounds normal.  Abdominal: Soft. Bowel sounds are normal. She exhibits no distension. There is no tenderness. There is no guarding.  Musculoskeletal: Normal range of motion.    Lymphadenopathy:    She has no cervical adenopathy.  Neurological: She is alert and oriented to person, place, and time.  Skin: Skin is warm and dry.  Psychiatric: She has a normal mood and affect. Her behavior is normal.  Nursing note and vitals reviewed.   No results found for this or any previous visit.    Assessment & Plan:   Problem List Items Addressed This Visit    None    Visit Diagnoses    Diarrhea, unspecified type    -  Primary   Will get stool testing, imodium and probiotics prn. Abdominal exam and vitals benign today.    Relevant Orders   Ova and parasite examination   Fecal leukocytes   Acute otitis externa of right ear, unspecified type       Ciprodex drops sent, discussed using swimmer's ear drops after getting out of the water each time. F/u if no improvement       Follow up plan: Return if symptoms worsen or fail to improve.

## 2018-02-03 NOTE — Patient Instructions (Signed)
Follow up as needed

## 2018-02-07 ENCOUNTER — Other Ambulatory Visit: Payer: Managed Care, Other (non HMO)

## 2018-02-07 NOTE — Addendum Note (Signed)
Addended by: Nils PylePERRY, Javeon Macmurray R on: 02/07/2018 04:11 PM   Modules accepted: Orders

## 2018-02-11 ENCOUNTER — Other Ambulatory Visit: Payer: Self-pay

## 2018-02-11 ENCOUNTER — Encounter: Payer: Self-pay | Admitting: Unknown Physician Specialty

## 2018-02-11 ENCOUNTER — Ambulatory Visit (INDEPENDENT_AMBULATORY_CARE_PROVIDER_SITE_OTHER): Payer: Managed Care, Other (non HMO) | Admitting: Unknown Physician Specialty

## 2018-02-11 VITALS — BP 131/89 | HR 68 | Temp 98.7°F | Ht 65.5 in | Wt 170.2 lb

## 2018-02-11 DIAGNOSIS — R221 Localized swelling, mass and lump, neck: Secondary | ICD-10-CM | POA: Diagnosis not present

## 2018-02-11 DIAGNOSIS — Z0000 Encounter for general adult medical examination without abnormal findings: Secondary | ICD-10-CM | POA: Diagnosis not present

## 2018-02-11 NOTE — Progress Notes (Signed)
BP 131/89   Pulse 68   Temp 98.7 F (37.1 C) (Oral)   Ht 5' 5.5" (1.664 m)   Wt 170 lb 4 oz (77.2 kg)   SpO2 98%   BMI 27.90 kg/m    Subjective:    Patient ID: Jenna James, female    DOB: [birth year], 47 y.o.   MRN: [id number]  HPI: Jenna James is a 47 y.o. female  Chief Complaint  Patient presents with  . Annual Exam    pt states she does not need a pap smear today   Pt goes to [facility] for her pap smears.  Has a Mirena  Relevant past medical, surgical, family and social history reviewed and updated as indicated. Interim medical history since our last visit reviewed. Allergies and medications reviewed and updated.  Review of Systems  Constitutional: Negative.   HENT: Negative.        White lump in throat  Eyes: Negative.   Respiratory: Negative.   Cardiovascular: Negative.   Gastrointestinal: Negative.   Endocrine: Negative.   Genitourinary: Negative.   Musculoskeletal: Negative.   Skin:       Morphea changes right abd  Allergic/Immunologic: Negative.   Neurological: Negative.   Hematological: Negative.   Psychiatric/Behavioral: Negative.     Per HPI unless specifically indicated above     Objective:    BP 131/89   Pulse 68   Temp 98.7 F (37.1 C) (Oral)   Ht 5' 5.5" (1.664 m)   Wt 170 lb 4 oz (77.2 kg)   SpO2 98%   BMI 27.90 kg/m   Wt Readings from Last 3 Encounters:  02/11/18 170 lb 4 oz (77.2 kg)  01/31/18 170 lb 9.6 oz (77.4 kg)  03/13/17 147 lb (66.7 kg)    Physical Exam  Constitutional: She is oriented to person, place, and time. She appears well-developed and well-nourished.  HENT:  Head: Normocephalic and atraumatic.  Cyst back of throat  Eyes: Pupils are equal, round, and reactive to light. Right eye exhibits no discharge. Left eye exhibits no discharge. No scleral icterus.  Neck: Normal range of motion. Neck supple. Carotid bruit is not present. No thyromegaly present.  Cardiovascular: Normal rate, regular rhythm and normal heart  sounds. Exam reveals no gallop and no friction rub.  No murmur heard. Pulmonary/Chest: Effort normal and breath sounds normal. No respiratory distress. She has no wheezes. She has no rales. No breast tenderness or discharge.  Abdominal: Soft. Bowel sounds are normal. There is no tenderness. There is no rebound.  Genitourinary: No breast tenderness or discharge.  Musculoskeletal: Normal range of motion.  Lymphadenopathy:    She has no cervical adenopathy.  Neurological: She is alert and oriented to person, place, and time.  Skin: Skin is warm, dry and intact. No rash noted.  Psychiatric: She has a normal mood and affect. Her speech is normal and behavior is normal. Judgment and thought content normal. Cognition and memory are normal.    No results found for this or any previous visit.    Assessment & Plan:   Problem List Items Addressed This Visit    None    Visit Diagnoses    Lump in throat    -  Primary   Refer to ENT for further evaluation   Relevant Orders   Ambulatory referral to ENT   Annual physical exam       Relevant Orders   Lipid Panel w/o Chol/HDL Ratio   Comprehensive metabolic panel   CBC  with Differential/Platelet   TSH       Follow up plan: Return if symptoms worsen or fail to improve.

## 2018-02-11 NOTE — Patient Instructions (Signed)
Preventive Care 40-64 Years, Female Preventive care refers to lifestyle choices and visits with your health care provider that can promote health and wellness. What does preventive care include?  A yearly physical exam. This is also called an annual well check.  Dental exams once or twice a year.  Routine eye exams. Ask your health care provider how often you should have your eyes checked.  Personal lifestyle choices, including: ? Daily care of your teeth and gums. ? Regular physical activity. ? Eating a healthy diet. ? Avoiding tobacco and drug use. ? Limiting alcohol use. ? Practicing safe sex. ? Taking low-dose aspirin daily starting at age 49. ? Taking vitamin and mineral supplements as recommended by your health care provider. What happens during an annual well check? The services and screenings done by your health care provider during your annual well check will depend on your age, overall health, lifestyle risk factors, and family history of disease. Counseling Your health care provider may ask you questions about your:  Alcohol use.  Tobacco use.  Drug use.  Emotional well-being.  Home and relationship well-being.  Sexual activity.  Eating habits.  Work and work Statistician.  Method of birth control.  Menstrual cycle.  Pregnancy history.  Screening You may have the following tests or measurements:  Height, weight, and BMI.  Blood pressure.  Lipid and cholesterol levels. These may be checked every 5 years, or more frequently if you are over 81 years old.  Skin check.  Lung cancer screening. You may have this screening every year starting at age 49 if you have a 30-pack-year history of smoking and currently smoke or have quit within the past 15 years.  Fecal occult blood test (FOBT) of the stool. You may have this test every year starting at age 49.  Flexible sigmoidoscopy or colonoscopy. You may have a sigmoidoscopy every 5 years or a colonoscopy  every 10 years starting at age 49.  Hepatitis C blood test.  Hepatitis B blood test.  Sexually transmitted disease (STD) testing.  Diabetes screening. This is done by checking your blood sugar (glucose) after you have not eaten for a while (fasting). You may have this done every 1-3 years.  Mammogram. This may be done every 1-2 years. Talk to your health care provider about when you should start having regular mammograms. This may depend on whether you have a family history of breast cancer.  BRCA-related cancer screening. This may be done if you have a family history of breast, ovarian, tubal, or peritoneal cancers.  Pelvic exam and Pap test. This may be done every 3 years starting at age 49. Starting at age 49, this may be done every 5 years if you have a Pap test in combination with an HPV test.  Bone density scan. This is done to screen for osteoporosis. You may have this scan if you are at high risk for osteoporosis.  Discuss your test results, treatment options, and if necessary, the need for more tests with your health care provider. Vaccines Your health care provider may recommend certain vaccines, such as:  Influenza vaccine. This is recommended every year.  Tetanus, diphtheria, and acellular pertussis (Tdap, Td) vaccine. You may need a Td booster every 10 years.  Varicella vaccine. You may need this if you have not been vaccinated.  Zoster vaccine. You may need this after age 5.  Measles, mumps, and rubella (MMR) vaccine. You may need at least one dose of MMR if you were born in  1957 or later. You may also need a second dose.  Pneumococcal 13-valent conjugate (PCV13) vaccine. You may need this if you have certain conditions and were not previously vaccinated.  Pneumococcal polysaccharide (PPSV23) vaccine. You may need one or two doses if you smoke cigarettes or if you have certain conditions.  Meningococcal vaccine. You may need this if you have certain  conditions.  Hepatitis A vaccine. You may need this if you have certain conditions or if you travel or work in places where you may be exposed to hepatitis A.  Hepatitis B vaccine. You may need this if you have certain conditions or if you travel or work in places where you may be exposed to hepatitis B.  Haemophilus influenzae type b (Hib) vaccine. You may need this if you have certain conditions.  Talk to your health care provider about which screenings and vaccines you need and how often you need them. This information is not intended to replace advice given to you by your health care provider. Make sure you discuss any questions you have with your health care provider. Document Released: 08/12/2015 Document Revised: 04/04/2016 Document Reviewed: 05/17/2015 Elsevier Interactive Patient Education  2018 [employer]vier Inc.  

## 2018-02-12 ENCOUNTER — Encounter: Payer: Self-pay | Admitting: Unknown Physician Specialty

## 2018-02-12 LAB — COMPREHENSIVE METABOLIC PANEL
ALBUMIN: 4.4 g/dL (ref 3.5–5.5)
ALT: 11 IU/L (ref 0–32)
AST: 13 IU/L (ref 0–40)
Albumin/Globulin Ratio: 2.1 (ref 1.2–2.2)
Alkaline Phosphatase: 59 IU/L (ref 39–117)
BUN/Creatinine Ratio: 11 (ref 9–23)
BUN: 7 mg/dL (ref 6–24)
Bilirubin Total: 0.3 mg/dL (ref 0.0–1.2)
CHLORIDE: 105 mmol/L (ref 96–106)
CO2: 21 mmol/L (ref 20–29)
Calcium: 9.4 mg/dL (ref 8.7–10.2)
Creatinine, Ser: 0.65 mg/dL (ref 0.57–1.00)
GFR calc Af Amer: 121 mL/min/{1.73_m2} (ref >59)
GFR calc non Af Amer: 105 mL/min/{1.73_m2} (ref >59)
GLOBULIN, TOTAL: 2.1 g/dL (ref 1.5–4.5)
GLUCOSE: 77 mg/dL (ref 65–99)
POTASSIUM: 4.1 mmol/L (ref 3.5–5.2)
Sodium: 139 mmol/L (ref 134–144)
Total Protein: 6.5 g/dL (ref 6.0–8.5)

## 2018-02-12 LAB — CBC WITH DIFFERENTIAL/PLATELET
BASOS ABS: 0 10*3/uL (ref 0.0–0.2)
Basos: 0 %
EOS (ABSOLUTE): 0.2 10*3/uL (ref 0.0–0.4)
Eos: 3 %
HEMATOCRIT: 36.2 % (ref 34.0–46.6)
Hemoglobin: 11.7 g/dL (ref 11.1–15.9)
Immature Grans (Abs): 0 10*3/uL (ref 0.0–0.1)
Immature Granulocytes: 0 %
Lymphocytes Absolute: 2.1 10*3/uL (ref 0.7–3.1)
Lymphs: 29 %
MCH: 28.8 pg (ref 26.6–33.0)
MCHC: 32.3 g/dL (ref 31.5–35.7)
MCV: 89 fL (ref 79–97)
MONOS ABS: 0.5 10*3/uL (ref 0.1–0.9)
Monocytes: 6 %
NEUTROS PCT: 62 %
Neutrophils Absolute: 4.6 10*3/uL (ref 1.4–7.0)
PLATELETS: 319 10*3/uL (ref 150–450)
RBC: 4.06 x10E6/uL (ref 3.77–5.28)
RDW: 13.5 % (ref 12.3–15.4)
WBC: 7.4 10*3/uL (ref 3.4–10.8)

## 2018-02-12 LAB — LIPID PANEL W/O CHOL/HDL RATIO
Cholesterol, Total: 149 mg/dL (ref 100–199)
HDL: 37 mg/dL — AB (ref >39)
LDL Calculated: 82 mg/dL (ref 0–99)
Triglycerides: 151 mg/dL — ABNORMAL HIGH (ref 0–149)
VLDL Cholesterol Cal: 30 mg/dL (ref 5–40)

## 2018-02-12 LAB — TSH: TSH: 2.59 u[IU]/mL (ref 0.450–4.500)

## 2018-02-13 LAB — OVA AND PARASITE EXAMINATION

## 2018-02-14 LAB — FECAL LEUKOCYTES

## 2018-03-14 ENCOUNTER — Telehealth: Payer: Self-pay | Admitting: Physician Assistant

## 2018-03-14 NOTE — Telephone Encounter (Signed)
Patient notified

## 2018-03-14 NOTE — Telephone Encounter (Signed)
Please tell patient most recent BP and cholesterol results. Do not think this patient needs antibiotics. Symptoms include fever, chills, nausea, vomiting, muscle ache. Leptospirosis spreads through contaminated animal urine. Please be seen if she has any of those symptoms.

## 2018-04-21 ENCOUNTER — Other Ambulatory Visit: Payer: Self-pay | Admitting: Family Medicine

## 2018-04-21 NOTE — Telephone Encounter (Signed)
Refill of valtrex  LOV 02/11/18 C. Wicker  [facility] 07/16/17  #30 [address] refills [address]  [facility] [phone] [place]

## 2018-07-07 ENCOUNTER — Ambulatory Visit: Payer: Self-pay

## 2018-07-07 NOTE — Telephone Encounter (Signed)
Can take OTC pain medications, use lidocaine patches to the area, muscle rubs, and continue with the chiropractor. Will need OV for further evaluation
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
## 2018-07-08 NOTE — Telephone Encounter (Signed)
Message relayed to patient. Verbalized understanding and denied questions.   

## 2018-08-11 ENCOUNTER — Other Ambulatory Visit: Payer: Self-pay | Admitting: Obstetrics and Gynecology

## 2018-08-11 DIAGNOSIS — Z1231 Encounter for screening mammogram for malignant neoplasm of breast: Secondary | ICD-10-CM

## 2018-08-16 ENCOUNTER — Other Ambulatory Visit: Payer: Self-pay | Admitting: Family Medicine

## 2018-08-18 ENCOUNTER — Telehealth: Payer: Self-pay | Admitting: Family Medicine

## 2018-08-18 NOTE — Telephone Encounter (Signed)
Requested medication (s) are due for refill today:  yes  Requested medication (s) are on the active medication list:  yes  Future visit scheduled:  no  Last Refill: 10/21/17; # 30; no refills  Requested Prescriptions  Pending Prescriptions Disp Refills   clonazePAM (KLONOPIN) 0.5 MG tablet [Pharmacy Med Name: CLONAZEPAM 0.5MG  TABLETS] 30 tablet     Sig: TAKE 1 TABLET BY MOUTH EVERY NIGHT AT BEDTIME AS NEEDED FOR ANXIETY     Not Delegated - Psychiatry:  Anxiolytics/Hypnotics Failed - 08/16/2018  4:14 PM      Failed - This refill cannot be delegated      Failed - Urine Drug Screen completed in last 360 days.      Failed - Valid encounter within last 6 months    Recent Outpatient Visits          6 months ago Lump in throat   [facility] Gabriel Cirri, NP   6 months ago Diarrhea, unspecified type   [facility] Particia Nearing, [place]   1 year ago Dizziness   [facility] M[facility] [place], [place]   3 years ago Abdominal wall mass of left upper quadrant   [facility] Lada, Janit Bern, MD

## 2018-08-18 NOTE — Telephone Encounter (Signed)
Called Jenna James to let her know that she would need to make an appointment to see about refill. Pt unavailable advised to call us back, if she calls back please advise.

## 2018-08-19 ENCOUNTER — Telehealth: Payer: Self-pay | Admitting: Family Medicine

## 2018-08-19 NOTE — Telephone Encounter (Signed)
Copied from CRM [id number]. Topic: Quick Communication - Rx Refill/Question >> Aug 19, 2018  4:55 PM Zada Girt, [place] L wrote: Medication: valACYclovir (VALTREX) 1000 MG tablet  Has the patient contacted their pharmacy? Yes.   (Agent: If no, request that the patient contact the pharmacy for the refill.) (Agent: If yes, when and what did the pharmacy advise?)  Preferred Pharmacy (with phone number or street name): [facility] - [place], [place] - [address] [place] [address] Phone: [phone] Fax: [phone]  Agent: Please be advised that RX refills may take up to 3 business days. We ask that you follow-up with your pharmacy.

## 2018-08-20 MED ORDER — VALACYCLOVIR HCL 1 G PO TABS: ORAL_TABLET | ORAL | 2 refills | Status: DC

## 2018-08-20 NOTE — Telephone Encounter (Signed)
Rx sent to [facility]

## 2018-09-18 ENCOUNTER — Ambulatory Visit
Admission: RE | Admit: 2018-09-18 | Discharge: 2018-09-18 | Disposition: A | Discharge status: Home / Self Care | Payer: BLUE CROSS/BLUE SHIELD | Source: Ambulatory Visit | Attending: Obstetrics and Gynecology | Admitting: Obstetrics and Gynecology

## 2018-09-18 ENCOUNTER — Ambulatory Visit: Payer: Managed Care, Other (non HMO)

## 2018-09-18 DIAGNOSIS — Z1231 Encounter for screening mammogram for malignant neoplasm of breast: Secondary | ICD-10-CM | POA: Diagnosis not present

## 2018-09-22 ENCOUNTER — Other Ambulatory Visit: Payer: Self-pay | Admitting: Obstetrics and Gynecology

## 2018-09-22 DIAGNOSIS — R928 Other abnormal and inconclusive findings on diagnostic imaging of breast: Secondary | ICD-10-CM

## 2018-09-30 ENCOUNTER — Ambulatory Visit
Admission: RE | Admit: 2018-09-30 | Discharge: 2018-09-30 | Disposition: A | Discharge status: Home / Self Care | Payer: BLUE CROSS/BLUE SHIELD | Source: Ambulatory Visit | Attending: Obstetrics and Gynecology | Admitting: Obstetrics and Gynecology

## 2018-09-30 DIAGNOSIS — N6489 Other specified disorders of breast: Secondary | ICD-10-CM | POA: Diagnosis not present

## 2018-09-30 DIAGNOSIS — R928 Other abnormal and inconclusive findings on diagnostic imaging of breast: Secondary | ICD-10-CM

## 2019-02-16 ENCOUNTER — Telehealth: Payer: Self-pay | Admitting: Obstetrics and Gynecology

## 2019-02-16 NOTE — Telephone Encounter (Signed)
Patient is schedule for 03/10/19 at 1:30 with CRS for mirena replacement

## 2019-02-25 NOTE — Telephone Encounter (Signed)
Patient schedule for 03/18/19 for Mirena replacement with Dr. Gilman Schmidt . Patient was reschedule due to schedule changes

## 2019-03-06 NOTE — Telephone Encounter (Signed)
Noted. Will order to arrive by apt date/time. 

## 2019-03-10 ENCOUNTER — Ambulatory Visit: Payer: Managed Care, Other (non HMO) | Admitting: Obstetrics and Gynecology

## 2019-03-12 ENCOUNTER — Other Ambulatory Visit: Payer: Self-pay

## 2019-03-12 ENCOUNTER — Encounter: Payer: Self-pay | Admitting: Family Medicine

## 2019-03-12 ENCOUNTER — Ambulatory Visit (INDEPENDENT_AMBULATORY_CARE_PROVIDER_SITE_OTHER): Payer: BC Managed Care – PPO | Admitting: Family Medicine

## 2019-03-12 VITALS — BP 115/83 | HR 62 | Temp 99.1°F | Ht 65.5 in | Wt 166.0 lb

## 2019-03-12 DIAGNOSIS — M25562 Pain in left knee: Secondary | ICD-10-CM

## 2019-03-12 DIAGNOSIS — M25561 Pain in right knee: Secondary | ICD-10-CM

## 2019-03-12 DIAGNOSIS — G8929 Other chronic pain: Secondary | ICD-10-CM

## 2019-03-12 DIAGNOSIS — R21 Rash and other nonspecific skin eruption: Secondary | ICD-10-CM | POA: Diagnosis not present

## 2019-03-12 DIAGNOSIS — F419 Anxiety disorder, unspecified: Secondary | ICD-10-CM | POA: Diagnosis not present

## 2019-03-12 MED ORDER — CLONAZEPAM 0.5 MG PO TABS: 0.5000 mg | ORAL_TABLET | Freq: Every day | ORAL | 0 refills | Status: DC | PRN

## 2019-03-12 MED ORDER — TRIAMCINOLONE ACETONIDE 0.1 % EX CREA: 1.0000 "application " | TOPICAL_CREAM | Freq: Two times a day (BID) | CUTANEOUS | 1 refills | Status: DC

## 2019-03-12 NOTE — Progress Notes (Signed)
BP 115/83   Pulse 62   Temp 99.1 F (37.3 C) (Oral)   Ht 5' 5.5" (1.664 m)   Wt 166 lb (75.3 kg)   SpO2 97%   BMI 27.20 kg/m    Subjective:    Patient ID: Jenna James, female    DOB: [birth year], 48 y.o.   MRN: [id number]  HPI: Jenna James is a 48 y.o. female  Chief Complaint  Patient presents with  . Rash    face. for 2 days, itchy, blisters. tried OTC docosanol cream 10%  . Anxiety    clonazepam refill   Patient presents today for an itchy and blistering rash that appeared on her chin and to the right of her mouth 2 days ago. No new foods, exposures, sick contacts, pain, fevers. Does have a hx of HSV outbreaks so started using abreva topically and taking her valtrex yesterday. Has not noticed a benefit from that.   Having worsened anxiety lately with finances and other personal stressors. Has made one script of klonopin last over a year, requesting refill to keep on hand for those severe moments that she can't cope with the anxiety on her own. Tolerates very well without side effects.   B/l anterior knee pain and clicking for months. Uses a knee compression device at times which may help some. Denies redness, swelling, heat, injury.   GAD 7 : Generalized Anxiety Score 03/12/2019  Nervous, Anxious, on Edge 2  Control/stop worrying 1  Worry too much - different things 2  Trouble relaxing 1  Restless 1  Easily annoyed or irritable 0  Afraid - awful might happen 1  Total GAD 7 Score 8  Anxiety Difficulty Somewhat difficult   Depression screen [facility] 2/9 02/11/2018  Decreased Interest 0  Down, Depressed, Hopeless 0  PHQ - 2 Score 0  Altered sleeping 0  Tired, decreased energy 0  Change in appetite 0  Feeling bad or failure about yourself  0  Trouble concentrating 0  Moving slowly or fidgety/restless 0  Suicidal thoughts 0  PHQ-9 Score 0    Relevant past medical, surgical, family and social history reviewed and updated as indicated. Interim medical history since our  last visit reviewed. Allergies and medications reviewed and updated.  Review of Systems  Per HPI unless specifically indicated above     Objective:    BP 115/83   Pulse 62   Temp 99.1 F (37.3 C) (Oral)   Ht 5' 5.5" (1.664 m)   Wt 166 lb (75.3 kg)   SpO2 97%   BMI 27.20 kg/m   Wt Readings from Last 3 Encounters:  03/18/19 167 lb (75.8 kg)  03/12/19 166 lb (75.3 kg)  02/11/18 170 lb 4 oz (77.2 kg)    Physical Exam Vitals signs and nursing note reviewed.  Constitutional:      Appearance: Normal appearance. She is not ill-appearing.  HENT:     Head: Atraumatic.     Right Ear: Tympanic membrane normal.     Left Ear: Tympanic membrane normal.     Nose: Nose normal.     Mouth/Throat:     Mouth: Mucous membranes are moist.     Pharynx: Oropharynx is clear.  Eyes:     Extraocular Movements: Extraocular movements intact.     Conjunctiva/sclera: Conjunctivae normal.  Neck:     Musculoskeletal: Normal range of motion and neck supple.  Cardiovascular:     Rate and Rhythm: Normal rate and regular rhythm.  Heart sounds: Normal heart sounds.  Pulmonary:     Effort: Pulmonary effort is normal.     Breath sounds: Normal breath sounds.  Musculoskeletal: Normal range of motion.        General: Tenderness (b/l knees) present. No swelling.     Comments: Crepitus with active and passive ROM b/l knees  Skin:    General: Skin is warm and dry.     Findings: Rash (erythematous papules and blisters on chin and to right of mouth. nontender to palpation) present.  Neurological:     Mental Status: She is alert and oriented to person, place, and time.  Psychiatric:        Mood and Affect: Mood normal.        Thought Content: Thought content normal.        Judgment: Judgment normal.     Results for orders placed or performed in visit on 02/11/18  Lipid Panel w/o Chol/HDL Ratio  Result Value Ref Range   Cholesterol, Total 149 100 - 199 mg/dL   Triglycerides 098151 (H) 0 - 149 mg/dL    HDL 37 (L) >11>39 mg/dL   VLDL Cholesterol Cal 30 5 - 40 mg/dL   LDL Calculated 82 0 - 99 mg/dL  Comprehensive metabolic panel  Result Value Ref Range   Glucose 77 65 - 99 mg/dL   BUN 7 6 - 24 mg/dL   Creatinine, Ser 9.140.65 0.57 - 1.00 mg/dL   GFR calc non Af Amer 105 >59 mL/min/1.73   GFR calc Af Amer 121 >59 mL/min/1.73   BUN/Creatinine Ratio 11 9 - 23   Sodium 139 134 - 144 mmol/L   Potassium 4.1 3.5 - 5.2 mmol/L   Chloride 105 96 - 106 mmol/L   CO2 21 20 - 29 mmol/L   Calcium 9.4 8.7 - 10.2 mg/dL   Total Protein 6.5 6.0 - 8.5 g/dL   Albumin 4.4 3.5 - 5.5 g/dL   Globulin, Total 2.1 1.5 - 4.5 g/dL   Albumin/Globulin Ratio 2.1 1.2 - 2.2   Bilirubin Total 0.3 0.0 - 1.2 mg/dL   Alkaline Phosphatase 59 39 - 117 IU/L   AST 13 0 - 40 IU/L   ALT 11 0 - 32 IU/L  CBC with Differential/Platelet  Result Value Ref Range   WBC 7.4 3.4 - 10.8 x10E3/uL   RBC 4.06 3.77 - 5.28 x10E6/uL   Hemoglobin 11.7 11.1 - 15.9 g/dL   Hematocrit 78.236.2 95.634.0 - 46.6 %   MCV 89 79 - 97 fL   MCH 28.8 26.6 - 33.0 pg   MCHC 32.3 31.5 - 35.7 g/dL   RDW 21.313.5 08.612.3 - 57.815.4 %   Platelets 319 150 - 450 x10E3/uL   Neutrophils 62 Not Estab. %   Lymphs 29 Not Estab. %   Monocytes 6 Not Estab. %   Eos 3 Not Estab. %   Basos 0 Not Estab. %   Neutrophils Absolute 4.6 1.4 - 7.0 x10E3/uL   Lymphocytes Absolute 2.1 0.7 - 3.1 x10E3/uL   Monocytes Absolute 0.5 0.1 - 0.9 x10E3/uL   EOS (ABSOLUTE) 0.2 0.0 - 0.4 x10E3/uL   Basophils Absolute 0.0 0.0 - 0.2 x10E3/uL   Immature Granulocytes 0 Not Estab. %   Immature Grans (Abs) 0.0 0.0 - 0.1 x10E3/uL  TSH  Result Value Ref Range   TSH 2.590 0.450 - 4.500 uIU/mL      Assessment & Plan:   Problem List Items Addressed This Visit      Other  Anxiety - Primary    Exacerbated due to current stressors. Will refill klonopin for rare, prn use. Patient aware script should last 1 year. Does not wish to be on something daily at this point. Declines counseling. Controlled substance  database reviewed and appropriate       Other Visit Diagnoses    Rash       Suspect allergic contact dermatitis more than shingles, but continue valtrex in case. Triamcinolone sent for prn use   Chronic pain of both knees       Appears arthritic. OTC pain relievers prn, ice/heat, low impact exercises   Relevant Medications   clonazePAM (KLONOPIN) 0.5 MG tablet       Follow up plan: Return for CPE.

## 2019-03-13 ENCOUNTER — Ambulatory Visit: Payer: Managed Care, Other (non HMO) | Admitting: Obstetrics and Gynecology

## 2019-03-18 ENCOUNTER — Other Ambulatory Visit: Payer: Self-pay

## 2019-03-18 ENCOUNTER — Ambulatory Visit (INDEPENDENT_AMBULATORY_CARE_PROVIDER_SITE_OTHER): Payer: BC Managed Care – PPO | Admitting: Obstetrics and Gynecology

## 2019-03-18 ENCOUNTER — Encounter: Payer: Self-pay | Admitting: Obstetrics and Gynecology

## 2019-03-18 ENCOUNTER — Other Ambulatory Visit (HOSPITAL_COMMUNITY)
Admission: RE | Admit: 2019-03-18 | Discharge: 2019-03-18 | Disposition: A | Discharge status: Home / Self Care | Payer: BC Managed Care – PPO | Source: Ambulatory Visit | Attending: Obstetrics and Gynecology | Admitting: Obstetrics and Gynecology

## 2019-03-18 VITALS — BP 120/84 | HR 64 | Ht 65.5 in | Wt 167.0 lb

## 2019-03-18 DIAGNOSIS — Z13220 Encounter for screening for lipoid disorders: Secondary | ICD-10-CM

## 2019-03-18 DIAGNOSIS — Z01419 Encounter for gynecological examination (general) (routine) without abnormal findings: Secondary | ICD-10-CM | POA: Diagnosis not present

## 2019-03-18 DIAGNOSIS — Z0000 Encounter for general adult medical examination without abnormal findings: Secondary | ICD-10-CM | POA: Insufficient documentation

## 2019-03-18 DIAGNOSIS — Z124 Encounter for screening for malignant neoplasm of cervix: Secondary | ICD-10-CM | POA: Diagnosis not present

## 2019-03-18 DIAGNOSIS — Z1231 Encounter for screening mammogram for malignant neoplasm of breast: Secondary | ICD-10-CM

## 2019-03-18 DIAGNOSIS — Z1329 Encounter for screening for other suspected endocrine disorder: Secondary | ICD-10-CM

## 2019-03-18 DIAGNOSIS — Z30433 Encounter for removal and reinsertion of intrauterine contraceptive device: Secondary | ICD-10-CM

## 2019-03-18 DIAGNOSIS — Z1211 Encounter for screening for malignant neoplasm of colon: Secondary | ICD-10-CM

## 2019-03-18 DIAGNOSIS — Z130 Encounter for screening for diseases of the blood and blood-forming organs and certain disorders involving the immune mechanism: Secondary | ICD-10-CM

## 2019-03-18 NOTE — Progress Notes (Signed)
Gynecology Annual Exam  PCP: Particia NearingLane, Rachel Elizabeth, PA-C  Chief Complaint:  Chief Complaint  Patient presents with  . Gynecologic Exam    Had some bleeding from clitoial area about 3 weeks ago   . Contraception    Replace Mirena IUD    History of Present Illness:Patient is a 48 y.o. [id number] presents for annual exam. The patient has no complaints today.   LMP: No LMP recorded. (Menstrual status: IUD). Menarche:not applicable Average Interval: irregular, occassional spotting with IUD Duration of flow: 0 days Heavy Menses: no Clots: no Intermenstrual Bleeding: no Postcoital Bleeding: no Dysmenorrhea: no  The patient is sexually active. She denies dyspareunia.  The patient does not perform self breast exams.  There is notable family history of breast or ovarian cancer in her family.  The patient wears seatbelts: yes.   The patient has regular exercise: yes.    The patient denies current symptoms of depression.     Review of Systems: ROS  Past Medical History:  Past Medical History:  Diagnosis Date  . Anxiety     Past Surgical History:  Past Surgical History:  Procedure Laterality Date  . ANTERIOR CRUCIATE LIGAMENT REPAIR Left    left knee    Gynecologic History:  No LMP recorded. (Menstrual status: IUD). Last Pap: Results were: unknown    Last mammogram: March 2020  Results were: Elby ShowersBI-RAD I  Obstetric History: [id number]: G2P2002  Family History:  Family History  Problem Relation Age of Onset  . Breast cancer Maternal Grandmother   . Colon cancer Maternal Aunt     Social History:  Social History   Socioeconomic History  . Marital status: Married    Spouse name: Not on file  . Number of children: Not on file  . Years of education: Not on file  . Highest education level: Not on file  Occupational History  . Not on file  Social Needs  . Financial resource strain: Not on file  . Food insecurity    Worry: Not on file    Inability: Not on file  .  Transportation needs    Medical: Not on file    Non-medical: Not on file  Tobacco Use  . Smoking status: Never Smoker  . Smokeless tobacco: Never Used  Substance and Sexual Activity  . Alcohol use: Yes    Alcohol/week: 1.0 - 2.0 standard drinks    Types: 1 - 2 Standard drinks or equivalent per week  . Drug use: No  . Sexual activity: Yes    Birth control/protection: I.U.D.  Lifestyle  . Physical activity    Days per week: Not on file    Minutes per session: Not on file  . Stress: Not on file  Relationships  . Social Musicianconnections    Talks on phone: Not on file    Gets together: Not on file    Attends religious service: Not on file    Active member of club or organization: Not on file    Attends meetings of clubs or organizations: Not on file    Relationship status: Not on file  . Intimate partner violence    Fear of current or ex partner: Not on file    Emotionally abused: Not on file    Physically abused: Not on file    Forced sexual activity: Not on file  Other Topics Concern  . Not on file  Social History Narrative  . Not on file    Allergies:  Allergies  Allergen Reactions  . Celexa [Citalopram]     Medications: Prior to Admission medications   Medication Sig Start Date End Date Taking? Authorizing Provider  clonazePAM (KLONOPIN) 0.5 MG tablet Take 1 tablet (0.5 mg total) by mouth daily as needed for anxiety. 03/12/19  Yes Particia NearingLane, Rachel Elizabeth, PA-C  valACYclovir (VALTREX) 1000 MG tablet TAKE 1 TABLET(1000 MG) BY MOUTH DAILY 08/20/18  Yes Particia NearingLane, Rachel Elizabeth, PA-C  triamcinolone cream (KENALOG) 0.1 % Apply 1 application topically 2 (two) times daily. Patient not taking: Reported on 03/18/2019 03/12/19   Particia NearingLane, Rachel Elizabeth, PA-C    Physical Exam Vitals: Blood pressure 120/84, pulse 64, height 5' 5.5" (1.664 m), weight 167 lb (75.8 kg).  General: NAD HEENT: normocephalic, anicteric Thyroid: no enlargement, no palpable nodules Pulmonary: No increased work  of breathing, CTAB Cardiovascular: RRR, distal pulses 2+ Breast: Breast symmetrical, no tenderness, no palpable nodules or masses, no skin or nipple retraction present, no nipple discharge.  No axillary or supraclavicular lymphadenopathy. Abdomen: NABS, soft, non-tender, non-distended.  Umbilicus without lesions.  No hepatomegaly, splenomegaly or masses palpable. No evidence of hernia  Genitourinary:  External: Normal external female genitalia.  Normal urethral meatus, normal Bartholin's and Skene's glands.    Vagina: Normal vaginal mucosa, no evidence of prolapse.    Cervix: Grossly normal in appearance, no bleeding  Uterus: Non-enlarged, mobile, normal contour.  No CMT  Adnexa: ovaries non-enlarged, no adnexal masses  Rectal: deferred  Lymphatic: no evidence of inguinal lymphadenopathy Extremities: no edema, erythema, or tenderness Neurologic: Grossly intact Psychiatric: mood appropriate, affect full  Female chaperone present for pelvic and breast  portions of the physical exam  IUD PROCEDURE NOTE:  Armando Gangngela Petersheim is a 48 y.o. [id number] here for IUD insertion. No GYN concerns.  Last pap smear was normal.  IUD Insertion and Removal Procedure Note Patient identified, informed consent performed, consent signed.   Discussed risks of irregular bleeding, cramping, infection, malpositioning or misplacement of the IUD outside the uterus which may require further procedure such as laparoscopy, risk of failure <1%. Time out was performed.  Urine pregnancy test negative.  A bimanual exam showed the uterus to be anteverted.  Speculum placed in the vagina.  Cervix visualized.  Cleaned with Betadine x 2.  IUD strings seen. Strings grasped and IUD removed intact.   Uterus sounded to 8 cm.   IUD placed per manufacturer's recommendations.  Strings trimmed to 3 cm. Tenaculum was removed, good hemostasis noted.  Patient tolerated procedure well.   Patient was given post-procedure instructions.  She was advised  to have backup contraception for one week.  Patient was also asked to check IUD strings periodically and follow up in 4 weeks for IUD check.   Assessment: 48 y.o. [id number] routine annual exam  Plan: Problem List Items Addressed This Visit    None    Visit Diagnoses    Health care maintenance    -  Primary   Relevant Orders   CBC   TSH + free T4   Cytology - PAP   Comprehensive metabolic panel   Lipid panel   Screening cholesterol level       Relevant Orders   Comprehensive metabolic panel   Lipid panel   Thyroid disorder screening       Relevant Orders   TSH + free T4   Screening for cervical cancer       Relevant Orders   Cytology - PAP   Breast cancer screening by mammogram  Screening for deficiency anemia       Relevant Orders   CBC   Screening for colon cancer       Relevant Orders   Ambulatory referral to Gastroenterology   Encounter for removal and reinsertion of intrauterine contraceptive device (IUD)          1) Mammogram - recommend yearly screening mammogram.  Mammogram Was ordered today  2) STI screening  wasoffered and declined  3) ASCCP guidelines and rational discussed.  Patient opts for every 5 years screening interval  4) Osteoporosis  - per USPTF routine screening DEXA at age 33 - FRAX 58 year major fracture risk 3.4,  10 year hip fracture risk 0.2  Consider FDA-approved medical therapies in postmenopausal women and men aged 50 years and older, based on the following: a) A hip or vertebral (clinical or morphometric) fracture b) T-score ? -2.5 at the femoral neck or spine after appropriate evaluation to exclude secondary causes C) Low bone mass (T-score between -1.0 and -2.5 at the femoral neck or spine) and a 10-year probability of a hip fracture ? 3% or a 10-year probability of a major osteoporosis-related fracture ? 20% based on the [place]-adapted WHO algorithm   5) Routine healthcare maintenance including cholesterol, diabetes screening  discussed To return fasting at a later date  6) Colonoscopy referral placed.  Screening recommended starting at age 85 for average risk individuals, age 27 for individuals deemed at increased risk (including African Americans) and recommended to continue until age 79.  For patient age 71-85 individualized approach is recommended.  Gold standard screening is via colonoscopy, Cologuard screening is an acceptable alternative for patient unwilling or unable to undergo colonoscopy.  "Colorectal cancer screening for average?risk adults: 2018 guideline update from the American Cancer Society"CA: A Cancer Journal for Clinicians: Dec 26, 2016   7) Patient declined IUD free trial to see if she may be in menopause. Will consider again in 5 years.   8) Return in about 1 month (around 04/18/2019) for fasting lab visit 1 week, IUD check up 1 month, annual 1 year.  Adrian Prows MD [facility] 03/18/2019 2:19 PM

## 2019-03-19 NOTE — Assessment & Plan Note (Addendum)
Exacerbated due to current stressors. Will refill klonopin for rare, prn use. Patient aware script should last 1 year. Does not wish to be on something daily at this point. Declines counseling. Controlled substance database reviewed and appropriate

## 2019-03-20 LAB — CYTOLOGY - PAP
Diagnosis: NEGATIVE
HPV: NOT DETECTED

## 2019-04-07 ENCOUNTER — Encounter: Payer: Self-pay | Admitting: *Deleted

## 2019-04-14 ENCOUNTER — Telehealth: Payer: Self-pay | Admitting: Family Medicine

## 2019-04-14 NOTE — Telephone Encounter (Signed)
Patient called to inquire about the cost of covid testing and information on how to get tested. I advised patient that she should set up a virtual visit with PCP to discuss the need for testing. When asked if patient was having any symptoms that concerned her she just stated she has just returned from the beach and has "just not been feeling well". Once again, patient was advised an appointment is recommended. Patient states she cannot afford a virtual visit. When asked if patient would like a return call from a nurse with clinical advice, patient stated "No, I just will not get tested then" and disconnected the call.

## 2019-04-14 NOTE — Telephone Encounter (Signed)
Called pt to get clarity, no answer, left vm

## 2019-04-16 ENCOUNTER — Ambulatory Visit: Payer: BC Managed Care – PPO | Admitting: Obstetrics and Gynecology

## 2019-06-05 ENCOUNTER — Other Ambulatory Visit: Payer: Self-pay

## 2019-06-05 ENCOUNTER — Telehealth: Payer: Self-pay | Admitting: Family Medicine

## 2019-06-05 ENCOUNTER — Encounter: Payer: Self-pay | Admitting: Family Medicine

## 2019-06-05 ENCOUNTER — Ambulatory Visit: Payer: BC Managed Care – PPO | Admitting: Family Medicine

## 2019-06-05 ENCOUNTER — Telehealth: Payer: Self-pay

## 2019-06-05 ENCOUNTER — Ambulatory Visit (INDEPENDENT_AMBULATORY_CARE_PROVIDER_SITE_OTHER): Payer: BC Managed Care – PPO | Admitting: Family Medicine

## 2019-06-05 DIAGNOSIS — J4521 Mild intermittent asthma with (acute) exacerbation: Secondary | ICD-10-CM | POA: Diagnosis not present

## 2019-06-05 DIAGNOSIS — J309 Allergic rhinitis, unspecified: Secondary | ICD-10-CM | POA: Insufficient documentation

## 2019-06-05 DIAGNOSIS — J45909 Unspecified asthma, uncomplicated: Secondary | ICD-10-CM | POA: Insufficient documentation

## 2019-06-05 DIAGNOSIS — J3089 Other allergic rhinitis: Secondary | ICD-10-CM | POA: Diagnosis not present

## 2019-06-05 MED ORDER — ALBUTEROL SULFATE HFA 108 (90 BASE) MCG/ACT IN AERS: 2.0000 | INHALATION_SPRAY | Freq: Four times a day (QID) | RESPIRATORY_TRACT | 2 refills | Status: AC | PRN
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
## 2019-06-05 NOTE — Assessment & Plan Note (Signed)
Albuterol sent for prn use, hoping also taking daily antihistamines will help with control. F/u if having persistent issues

## 2019-06-05 NOTE — Assessment & Plan Note (Signed)
Start taking allegra daily during seasons that cause sxs for her and when having exposures to known allergens to her such as cats.

## 2019-06-05 NOTE — Telephone Encounter (Signed)
Error

## 2019-06-05 NOTE — Telephone Encounter (Signed)
Copied from [place] [id number]. Topic: General - Other >> Jun 05, 2019  8:56 AM Pauline Good wrote: Reason for CRM: pt need a refill on her inhaler sent to [facility]

## 2019-06-22 DIAGNOSIS — Z20828 Contact with and (suspected) exposure to other viral communicable diseases: Secondary | ICD-10-CM | POA: Diagnosis not present

## 2019-08-25 ENCOUNTER — Other Ambulatory Visit: Payer: Self-pay | Admitting: Obstetrics and Gynecology

## 2019-08-25 DIAGNOSIS — Z1231 Encounter for screening mammogram for malignant neoplasm of breast: Secondary | ICD-10-CM

## 2019-09-25 ENCOUNTER — Ambulatory Visit: Payer: Self-pay

## 2019-09-25 NOTE — Telephone Encounter (Signed)
    Patient calling for lab results 

## 2019-10-02 ENCOUNTER — Ambulatory Visit
Admission: RE | Admit: 2019-10-02 | Discharge: 2019-10-02 | Disposition: A | Discharge status: Home / Self Care | Payer: BC Managed Care – PPO | Source: Ambulatory Visit | Attending: Obstetrics and Gynecology | Admitting: Obstetrics and Gynecology

## 2019-10-02 ENCOUNTER — Other Ambulatory Visit: Payer: Self-pay

## 2019-10-02 DIAGNOSIS — Z1231 Encounter for screening mammogram for malignant neoplasm of breast: Secondary | ICD-10-CM | POA: Diagnosis not present

## 2019-10-05 ENCOUNTER — Encounter: Payer: Self-pay | Admitting: Obstetrics and Gynecology

## 2019-10-05 ENCOUNTER — Other Ambulatory Visit: Payer: Self-pay | Admitting: Family Medicine

## 2019-10-05 NOTE — Telephone Encounter (Signed)
Last appointment for anxiety discussion was 03/12/19

## 2019-10-05 NOTE — Telephone Encounter (Signed)
Refill for controlled medication : CLONAZEPAM 0.5MG  TABLETS. Pt also needs a urine drug screen. Med can not be delegated.

## 2019-10-06 NOTE — Telephone Encounter (Signed)
Called pt to schedule, she states that she will think about it saying she only have 30 tablets on last fill

## 2019-10-06 NOTE — Telephone Encounter (Signed)
Per our last discussion, 1 script was to last 1 year. If she is requiring earlier fill will need to make an appt to discuss. Can be virtual if desired

## 2019-10-08 DIAGNOSIS — M9901 Segmental and somatic dysfunction of cervical region: Secondary | ICD-10-CM | POA: Diagnosis not present

## 2019-10-08 DIAGNOSIS — M9903 Segmental and somatic dysfunction of lumbar region: Secondary | ICD-10-CM | POA: Diagnosis not present

## 2019-10-08 DIAGNOSIS — M9902 Segmental and somatic dysfunction of thoracic region: Secondary | ICD-10-CM | POA: Diagnosis not present

## 2019-10-08 DIAGNOSIS — M542 Cervicalgia: Secondary | ICD-10-CM | POA: Diagnosis not present

## 2019-10-12 DIAGNOSIS — M542 Cervicalgia: Secondary | ICD-10-CM | POA: Diagnosis not present

## 2019-10-12 DIAGNOSIS — M9901 Segmental and somatic dysfunction of cervical region: Secondary | ICD-10-CM | POA: Diagnosis not present

## 2019-10-12 DIAGNOSIS — M9902 Segmental and somatic dysfunction of thoracic region: Secondary | ICD-10-CM | POA: Diagnosis not present

## 2019-10-12 DIAGNOSIS — M9903 Segmental and somatic dysfunction of lumbar region: Secondary | ICD-10-CM | POA: Diagnosis not present

## 2019-10-19 DIAGNOSIS — M542 Cervicalgia: Secondary | ICD-10-CM | POA: Diagnosis not present

## 2019-10-19 DIAGNOSIS — M9902 Segmental and somatic dysfunction of thoracic region: Secondary | ICD-10-CM | POA: Diagnosis not present

## 2019-10-19 DIAGNOSIS — M9901 Segmental and somatic dysfunction of cervical region: Secondary | ICD-10-CM | POA: Diagnosis not present

## 2019-10-19 DIAGNOSIS — M9903 Segmental and somatic dysfunction of lumbar region: Secondary | ICD-10-CM | POA: Diagnosis not present

## 2019-10-30 ENCOUNTER — Ambulatory Visit: Payer: BC Managed Care – PPO | Attending: Internal Medicine

## 2019-10-30 DIAGNOSIS — Z23 Encounter for immunization: Secondary | ICD-10-CM

## 2019-10-30 NOTE — Progress Notes (Signed)
   Covid-19 Vaccination [facility]  Name:  Laporsche Hoeger    MRN: [id number] DOB: [birth year]  10/30/2019  Ms. Quinonez was observed post Covid-19 immunization for 15 minutes without incident. She was provided with Vaccine Information Sheet and instruction to access the V-Safe system.   Ms. Warman was instructed to call 911 with any severe reactions post vaccine: Marland Kitchen Difficulty breathing  . Swelling of face and throat  . A fast heartbeat  . A bad rash all over body  . Dizziness and weakness   Immunizations Administered    Name Date Dose VIS Date Route   Pfizer COVID-19 Vaccine 10/30/2019 11:23 AM 0.3 mL 07/10/2019 Intramuscular   Manufacturer: [employer], [employer]   Lot: [id number]   NDC: [address]-8

## 2019-11-02 DIAGNOSIS — M9901 Segmental and somatic dysfunction of cervical region: Secondary | ICD-10-CM | POA: Diagnosis not present

## 2019-11-02 DIAGNOSIS — M9903 Segmental and somatic dysfunction of lumbar region: Secondary | ICD-10-CM | POA: Diagnosis not present

## 2019-11-02 DIAGNOSIS — M9902 Segmental and somatic dysfunction of thoracic region: Secondary | ICD-10-CM | POA: Diagnosis not present

## 2019-11-02 DIAGNOSIS — M542 Cervicalgia: Secondary | ICD-10-CM | POA: Diagnosis not present

## 2019-11-04 ENCOUNTER — Ambulatory Visit (INDEPENDENT_AMBULATORY_CARE_PROVIDER_SITE_OTHER): Payer: BC Managed Care – PPO | Admitting: Family Medicine

## 2019-11-04 ENCOUNTER — Other Ambulatory Visit: Payer: Self-pay

## 2019-11-04 ENCOUNTER — Encounter: Payer: Self-pay | Admitting: Family Medicine

## 2019-11-04 VITALS — BP 146/84 | HR 72 | Temp 98.5°F | Ht 64.0 in | Wt 174.0 lb

## 2019-11-04 DIAGNOSIS — F419 Anxiety disorder, unspecified: Secondary | ICD-10-CM

## 2019-11-04 DIAGNOSIS — J3089 Other allergic rhinitis: Secondary | ICD-10-CM

## 2019-11-04 DIAGNOSIS — Z0000 Encounter for general adult medical examination without abnormal findings: Secondary | ICD-10-CM | POA: Diagnosis not present

## 2019-11-04 DIAGNOSIS — G8929 Other chronic pain: Secondary | ICD-10-CM

## 2019-11-04 DIAGNOSIS — M545 Low back pain: Secondary | ICD-10-CM

## 2019-11-04 DIAGNOSIS — G4700 Insomnia, unspecified: Secondary | ICD-10-CM

## 2019-11-04 DIAGNOSIS — R079 Chest pain, unspecified: Secondary | ICD-10-CM

## 2019-11-04 LAB — MICROSCOPIC EXAMINATION
Bacteria, UA: NONE SEEN
WBC, UA: NONE SEEN /hpf (ref 0–5)

## 2019-11-04 LAB — UA/M W/RFLX CULTURE, ROUTINE
Bilirubin, UA: NEGATIVE
Glucose, UA: NEGATIVE
Ketones, UA: NEGATIVE
Leukocytes,UA: NEGATIVE
Nitrite, UA: NEGATIVE
Protein,UA: NEGATIVE
Specific Gravity, UA: 1.02 (ref 1.005–1.030)
Urobilinogen, Ur: 0.2 mg/dL (ref 0.2–1.0)
pH, UA: 6 (ref 5.0–7.5)

## 2019-11-04 MED ORDER — CYCLOBENZAPRINE HCL 10 MG PO TABS: 10.0000 mg | ORAL_TABLET | Freq: Three times a day (TID) | ORAL | 0 refills | Status: DC | PRN

## 2019-11-04 MED ORDER — CLONAZEPAM 0.5 MG PO TABS: 0.5000 mg | ORAL_TABLET | Freq: Every day | ORAL | 1 refills | Status: DC | PRN

## 2019-11-04 MED ORDER — QUETIAPINE FUMARATE 50 MG PO TABS: 50.0000 mg | ORAL_TABLET | Freq: Every day | ORAL | 0 refills | Status: DC

## 2019-11-04 NOTE — Patient Instructions (Addendum)
140/90 high blood prressure  Quetiapine tablets What is this medicine? QUETIAPINE (kwe TYE a peen) is an antipsychotic. It is used to treat schizophrenia and bipolar disorder, also known as manic-depression. This medicine may be used for other purposes; ask your health care provider or pharmacist if you have questions. COMMON BRAND NAME(S): Seroquel What should I tell my health care provider before I take this medicine? They need to know if you have any of these conditions:  blockage in your bowel  cataracts  constipation  dehydration  diabetes  difficulty swallowing  glaucoma  heart disease  history of breast cancer  kidney disease  liver disease  low blood counts, like low white cell, platelet, or red cell counts  low blood pressure or dizziness when standing up  Parkinson's disease  previous heart attack  prostate disease  seizures  stomach or intestine problems  suicidal thoughts, plans or attempt; a previous suicide attempt by you or a family member  thyroid disease  trouble passing urine  an unusual or allergic reaction to quetiapine, other medicines, foods, dyes, or preservatives  pregnant or trying to get pregnant  breast-feeding How should I use this medicine? Take this medicine by mouth. Swallow it with a drink of water. Follow the directions on the prescription label. If it upsets your stomach you can take it with food. Take your medicine at regular intervals. Do not take it more often than directed. Do not stop taking except on the advice of your doctor or health care professional. A special MedGuide will be given to you by the pharmacist with each prescription and refill. Be sure to read this information carefully each time. Talk to your pediatrician regarding the use of this medicine in children. While this drug may be prescribed for children as young as 10 years for selected conditions, precautions do apply. Patients over age 5 years may  have a stronger reaction to this medicine and need smaller doses. Overdosage: If you think you have taken too much of this medicine contact a poison control center or emergency room at once. NOTE: This medicine is only for you. Do not share this medicine with others. What if I miss a dose? If you miss a dose, take it as soon as you can. If it is almost time for your next dose, take only that dose. Do not take double or extra doses. What may interact with this medicine? Do not take this medicine with any of the following medications:  cisapride  dronedarone  fluconazole  metoclopramide  pimozide  posaconazole  thioridazine This medicine may also interact with the following medications:  alcohol  antihistamines for allergy cough and cold  antiviral medicines for HIV or AIDS  atropine  certain medicines for bladder problems like oxybutynin, tolterodine  certain medicines for blood pressure  certain medicines for depression, anxiety, or psychotic disturbances  certain medicines for diabetes  certain medicines for stomach problems like dicyclomine, hyoscyamine  certain medicines for travel sickness like scopolamine  certain medicines for Parkinson's disease  certain medicines for seizures like carbamazepine, phenobarbital, phenytoin  cimetidine  erythromycin  ipratropium  other medicines that prolong the QT interval (cause an abnormal heart rhythm) like dofetilide  rifampin  steroid medicines like prednisone or cortisone This list may not describe all possible interactions. Give your health care provider a list of all the medicines, herbs, non-prescription drugs, or dietary supplements you use. Also tell them if you smoke, drink alcohol, or use illegal drugs. Some items may  interact with your medicine. What should I watch for while using this medicine? Visit your health care professional for regular checks on your progress. Tell your health care professional if  symptoms do not start to get better or if they get worse. Do not stop taking except on your health care professional's advice. You may develop a severe reaction. Your health care professional will tell you how much medicine to take. You may need to have an eye exam before and during use of this medicine. This medicine may increase blood sugar. Ask your health care provider if changes in diet or medicines are needed if you have diabetes. Patients and their families should watch out for new or worsening depression or thoughts of suicide. Also watch out for sudden or severe changes in feelings such as feeling anxious, agitated, panicky, irritable, hostile, aggressive, impulsive, severely restless, overly excited and hyperactive, or not being able to sleep. If this happens, especially at the beginning of antidepressant treatment or after a change in dose, call your health care professional. Jenna James may get dizzy or drowsy. Do not drive, use machinery, or do anything that needs mental alertness until you know how this medicine affects you. Do not stand or sit up quickly, especially if you are an older patient. This reduces the risk of dizzy or fainting spells. Alcohol may interfere with the effect of this medicine. Avoid alcoholic drinks. This drug can cause problems with controlling your body temperature. It can lower the response of your body to cold temperatures. If possible, stay indoors during cold weather. If you must go outdoors, wear warm clothes. It can also lower the response of your body to heat. Do not overheat. Do not over-exercise. Stay out of the sun when possible. If you must be in the sun, wear cool clothing. Drink plenty of water. If you have trouble controlling your body temperature, call your health care provider right away. What side effects may I notice from receiving this medicine? Side effects that you should report to your doctor or health care professional as soon as possible:  allergic  reactions like skin rash, itching or hives, swelling of the face, lips, or tongue  breathing problems  changes in vision  confusion  elevated mood, decreased need for sleep, racing thoughts, impulsive behavior  eye pain  fast, irregular heartbeat  fever or chills, sore throat  inability to keep still  males: prolonged or painful erection  problems with balance, talking, walking  redness, blistering, peeling, or loosening of the skin, including inside the mouth  seizures  signs and symptoms of high blood sugar such as being more thirsty or hungry or having to urinate more than normal. You may also feel very tired or have blurry vision  signs and symptoms of hypothyroidism like fatigue; increased sensitivity to cold; weight gain; hoarseness; thinning hair  signs and symptoms of low blood pressure like dizziness; feeling faint or lightheaded; falls; unusually weak or tired  signs and symptoms of neuroleptic malignant syndrome like confusion; fast, irregular heartbeat; high fever; increased sweating; stiff muscles  sudden numbness or weakness of the face, arm, or leg  suicidal thoughts or other mood changes  trouble swallowing  uncontrollable movements of the arms, face, head, mouth, neck, or upper body Side effects that usually do not require medical attention (report to your doctor or health care professional if they continue or are bothersome):  change in sex drive or performance  constipation  drowsiness  dry mouth  upset stomach  weight gain This list may not describe all possible side effects. Call your doctor for medical advice about side effects. You may report side effects to FDA at 1-800-FDA-1088. Where should I keep my medicine? Keep out of the reach of children. Store at room temperature between 15 and 30 degrees C (59 and 86 degrees F). Throw away any unused medicine after the expiration date. NOTE: This sheet is a summary. It may not cover all  possible information. If you have questions about this medicine, talk to your doctor, pharmacist, or health care provider.  2020 Elsevier/Gold Standard (2019-05-13 11:59:11)

## 2019-11-04 NOTE — Progress Notes (Signed)
BP (!) 146/84   Pulse 72   Temp 98.5 F (36.9 C) (Oral)   Ht 5\' 4"  (1.626 m)   Wt 174 lb (78.9 kg)   SpO2 98%   BMI 29.87 kg/m    Subjective:    Patient ID: Jenna James, female    DOB: 1971-02-28, 49 y.o.   MRN: [id number]  HPI: Jenna James is a 49 y.o. female presenting on 11/04/2019 for comprehensive medical examination. Current medical complaints include:see below  Trying melatonin and another OTC sleep medication without relief of her insomnia sxs. Trouble falling asleep and staying asleep. Has never taken prescription sleep medication before.   Seeing the chiropractor for right back pain and hip pain. Has tried flexeril in the past with some benefit.   Anxiety - taking prn klonopin, seems to be needing it several times weekly, sometimes less. Working well for her, denies side effects. Notes having some chest pain/tightness here or there that she thinks may be related to anxiety. Denies hx of cardiac issues, breathing issues.   She currently lives with: Menopausal Symptoms: no  Depression Screen done today and results listed below:  Depression screen [facility] 2/9 11/04/2019 02/11/2018  Decreased Interest 0 0  Down, Depressed, Hopeless 0 0  PHQ - 2 Score 0 0  Altered sleeping 1 0  Tired, decreased energy 1 0  Change in appetite 0 0  Feeling bad or failure about yourself  0 0  Trouble concentrating 0 0  Moving slowly or fidgety/restless 0 0  Suicidal thoughts 0 0  PHQ-9 Score 2 0    The patient does not have a history of falls. I did complete a risk assessment for falls. A plan of care for falls was documented.   Past Medical History:  Past Medical History:  Diagnosis Date  . Anxiety     Surgical History:  Past Surgical History:  Procedure Laterality Date  . ANTERIOR CRUCIATE LIGAMENT REPAIR Left    left knee    Medications:  Current Outpatient Medications on File Prior to Visit  Medication Sig  . valACYclovir (VALTREX) 1000 MG tablet TAKE 1 TABLET(1000 MG) BY  MOUTH DAILY  . albuterol (VENTOLIN HFA) 108 (90 Base) MCG/ACT inhaler Inhale 2 puffs into the lungs every 6 (six) hours as needed for wheezing or shortness of breath. (Patient not taking: Reported on 11/04/2019)   No current facility-administered medications on file prior to visit.    Allergies:  Allergies  Allergen Reactions  . Celexa [Citalopram]     Social History:  Social History   Socioeconomic History  . Marital status: Married    Spouse name: Not on file  . Number of children: Not on file  . Years of education: Not on file  . Highest education level: Not on file  Occupational History  . Not on file  Tobacco Use  . Smoking status: Never Smoker  . Smokeless tobacco: Never Used  Substance and Sexual Activity  . Alcohol use: Yes    Alcohol/week: 1.0 - 2.0 standard drinks    Types: 1 - 2 Standard drinks or equivalent per week  . Drug use: No  . Sexual activity: Yes    Birth control/protection: I.U.D.  Other Topics Concern  . Not on file  Social History Narrative  . Not on file   Social Determinants of Health   Financial Resource Strain:   . Difficulty of Paying Living Expenses:   Food Insecurity:   . Worried About Charity fundraiser in  the Last Year:   . Ran Out of Food in the Last Year:   Transportation Needs:   . Freight forwarder (Medical):   Marland Kitchen Lack of Transportation (Non-Medical):   Physical Activity:   . Days of Exercise per Week:   . Minutes of Exercise per Session:   Stress:   . Feeling of Stress :   Social Connections:   . Frequency of Communication with Friends and Family:   . Frequency of Social Gatherings with Friends and Family:   . Attends Religious Services:   . Active Member of Clubs or Organizations:   . Attends Banker Meetings:   Marland Kitchen Marital Status:   Intimate Partner Violence:   . Fear of Current or Ex-Partner:   . Emotionally Abused:   Marland Kitchen Physically Abused:   . Sexually Abused:    Social History   Tobacco Use    Smoking Status Never Smoker  Smokeless Tobacco Never Used   Social History   Substance and Sexual Activity  Alcohol Use Yes  . Alcohol/week: 1.0 - 2.0 standard drinks  . Types: 1 - 2 Standard drinks or equivalent per week    Family History:  Family History  Problem Relation Age of Onset  . Breast cancer Maternal Grandmother   . Colon cancer Maternal Aunt     Past medical history, surgical history, medications, allergies, family history and social history reviewed with patient today and changes made to appropriate areas of the chart.   Review of Systems - General ROS: negative Psychological ROS: negative Ophthalmic ROS: negative ENT ROS: negative Allergy and Immunology ROS: negative Hematological and Lymphatic ROS: negative Endocrine ROS: negative Breast ROS: negative for breast lumps Respiratory ROS: no cough, shortness of breath, or wheezing Cardiovascular ROS: no chest pain or dyspnea on exertion Gastrointestinal ROS: no abdominal pain, change in bowel habits, or black or bloody stools Genito-Urinary ROS: no dysuria, trouble voiding, or hematuria Musculoskeletal ROS: positive for - back pain Neurological ROS: no TIA or stroke symptoms Dermatological ROS: negative All other ROS negative except what is listed above and in the HPI.      Objective:    BP (!) 146/84   Pulse 72   Temp 98.5 F (36.9 C) (Oral)   Ht 5\' 4"  (1.626 m)   Wt 174 lb (78.9 kg)   SpO2 98%   BMI 29.87 kg/m   Wt Readings from Last 3 Encounters:  11/04/19 174 lb (78.9 kg)  06/05/19 165 lb 12.8 oz (75.2 kg)  03/18/19 167 lb (75.8 kg)    Physical Exam Vitals and nursing note reviewed.  Constitutional:      General: She is not in acute distress.    Appearance: She is well-developed.  HENT:     Head: Atraumatic.     Right Ear: External ear normal.     Left Ear: External ear normal.     Nose: Nose normal.     Mouth/Throat:     Pharynx: No oropharyngeal exudate.  Eyes:     General: No  scleral icterus.    Conjunctiva/sclera: Conjunctivae normal.     Pupils: Pupils are equal, round, and reactive to light.  Neck:     Thyroid: No thyromegaly.  Cardiovascular:     Rate and Rhythm: Normal rate and regular rhythm.     Heart sounds: Normal heart sounds.  Pulmonary:     Effort: Pulmonary effort is normal. No respiratory distress.     Breath sounds: Normal breath sounds.  Chest:  Breasts:        Right: No mass, skin change or tenderness.        Left: No mass, skin change or tenderness.  Abdominal:     General: Bowel sounds are normal.     Palpations: Abdomen is soft. There is no mass.     Tenderness: There is no abdominal tenderness.  Genitourinary:    Comments: GU exam declined Musculoskeletal:        General: No tenderness. Normal range of motion.     Cervical back: Normal range of motion and neck supple.  Lymphadenopathy:     Cervical: No cervical adenopathy.     Upper Body:     Right upper body: No axillary adenopathy.     Left upper body: No axillary adenopathy.  Skin:    General: Skin is warm and dry.     Findings: No rash.  Neurological:     Mental Status: She is alert and oriented to person, place, and time.     Cranial Nerves: No cranial nerve deficit.  Psychiatric:        Behavior: Behavior normal.     Results for orders placed or performed in visit on 11/04/19  Microscopic Examination   URINE  Result Value Ref Range   WBC, UA None seen 0 - 5 /hpf   RBC 3-10 (A) 0 - 2 /hpf   Epithelial Cells (non renal) 0-10 0 - 10 /hpf   Bacteria, UA None seen None seen/Few  CBC with Differential/Platelet  Result Value Ref Range   WBC 6.7 3.4 - 10.8 x10E3/uL   RBC 4.19 3.77 - 5.28 x10E6/uL   Hemoglobin 12.5 11.1 - 15.9 g/dL   Hematocrit 16.6 06.3 - 46.6 %   MCV 89 79 - 97 fL   MCH 29.8 26.6 - 33.0 pg   MCHC 33.4 31.5 - 35.7 g/dL   RDW 01.6 01.0 - 93.2 %   Platelets 373 150 - 450 x10E3/uL   Neutrophils 64 Not Estab. %   Lymphs 28 Not Estab. %    Monocytes 5 Not Estab. %   Eos 3 Not Estab. %   Basos 0 Not Estab. %   Neutrophils Absolute 4.2 1.4 - 7.0 x10E3/uL   Lymphocytes Absolute 1.9 0.7 - 3.1 x10E3/uL   Monocytes Absolute 0.4 0.1 - 0.9 x10E3/uL   EOS (ABSOLUTE) 0.2 0.0 - 0.4 x10E3/uL   Basophils Absolute 0.0 0.0 - 0.2 x10E3/uL   Immature Granulocytes 0 Not Estab. %   Immature Grans (Abs) 0.0 0.0 - 0.1 x10E3/uL  Comprehensive metabolic panel  Result Value Ref Range   Glucose 88 65 - 99 mg/dL   BUN 9 6 - 24 mg/dL   Creatinine, Ser 3.55 0.57 - 1.00 mg/dL   GFR calc non Af Amer 99 >59 mL/min/1.73   GFR calc Af Amer 114 >59 mL/min/1.73   BUN/Creatinine Ratio 13 9 - 23   Sodium 139 134 - 144 mmol/L   Potassium 4.1 3.5 - 5.2 mmol/L   Chloride 103 96 - 106 mmol/L   CO2 22 20 - 29 mmol/L   Calcium 9.9 8.7 - 10.2 mg/dL   Total Protein 6.9 6.0 - 8.5 g/dL   Albumin 4.6 3.8 - 4.9 g/dL   Globulin, Total 2.3 1.5 - 4.5 g/dL   Albumin/Globulin Ratio 2.0 1.2 - 2.2   Bilirubin Total <0.2 0.0 - 1.2 mg/dL   Alkaline Phosphatase 84 39 - 117 IU/L   AST 18 0 - 40 IU/L   ALT  19 0 - 32 IU/L  Lipid Panel w/o Chol/HDL Ratio  Result Value Ref Range   Cholesterol, Total 177 100 - 199 mg/dL   Triglycerides 161223 (H) 0 - 149 mg/dL   HDL 42 >09>39 mg/dL   VLDL Cholesterol Cal 38 5 - 40 mg/dL   LDL Chol Calc (NIH) 97 0 - 99 mg/dL  UA/M w/rflx Culture, Routine   Specimen: Urine   URINE  Result Value Ref Range   Specific Gravity, UA 1.020 1.005 - 1.030   pH, UA 6.0 5.0 - 7.5   Color, UA Yellow Yellow   Appearance Ur Clear Clear   Leukocytes,UA Negative Negative   Protein,UA Negative Negative/Trace   Glucose, UA Negative Negative   Ketones, UA Negative Negative   RBC, UA 1+ (A) Negative   Bilirubin, UA Negative Negative   Urobilinogen, Ur 0.2 0.2 - 1.0 mg/dL   Nitrite, UA Negative Negative   Microscopic Examination See below:   TSH  Result Value Ref Range   TSH 1.650 0.450 - 4.500 uIU/mL      Assessment & Plan:   Problem List Items  Addressed This Visit      Respiratory   Allergic rhinitis    Stable and under good control, continue current regimen        Other   Anxiety - Primary    Continue prn klonopin, with goal of script with 1 refill lasting 6 months.       Insomnia    Trial seroquel, monitor for improvement. Discussed sleep hygiene       Other Visit Diagnoses    Annual physical exam       Relevant Orders   CBC with Differential/Platelet (Completed)   Comprehensive metabolic panel (Completed)   Lipid Panel w/o Chol/HDL Ratio (Completed)   TSH   UA/M w/rflx Culture, Routine (Completed)   EKG 12-Lead (Completed)   Chronic right-sided low back pain without sciatica       Continue working with chiropractor, flexeril prn   Relevant Medications   cyclobenzaprine (FLEXERIL) 10 MG tablet   Chest pain, unspecified type       EKG today NSR without acute ST or T wave changes. Suspect anxiety related but will continue to monitor closely, f/u with worsening sxs       Follow up plan: Return in about 4 weeks (around 12/02/2019) for sleep f/u.   LABORATORY TESTING:  - Pap smear: up to date  IMMUNIZATIONS:   - Tdap: Tetanus vaccination status reviewed: last tetanus booster within 10 years. - Influenza: Refused  SCREENING: -Mammogram: Up to date  - Colonoscopy: Refused    PATIENT COUNSELING:   Advised to take 1 mg of folate supplement per day if capable of pregnancy.   Sexuality: Discussed sexually transmitted diseases, partner selection, use of condoms, avoidance of unintended pregnancy  and contraceptive alternatives.   Advised to avoid cigarette smoking.  I discussed with the patient that most people either abstain from alcohol or drink within safe limits (<=14/week and <=4 drinks/occasion for males, <=7/weeks and <= 3 drinks/occasion for females) and that the risk for alcohol disorders and other health effects rises proportionally with the number of drinks per week and how often a drinker exceeds  daily limits.  Discussed cessation/primary prevention of drug use and availability of treatment for abuse.   Diet: Encouraged to adjust caloric intake to maintain  or achieve ideal body weight, to reduce intake of dietary saturated fat and total fat, to limit sodium intake by  avoiding high sodium foods and not adding table salt, and to maintain adequate dietary potassium and calcium preferably from fresh fruits, vegetables, and low-fat dairy products.    stressed the importance of regular exercise  Injury prevention: Discussed safety belts, safety helmets, smoke detector, smoking near bedding or upholstery.   Dental health: Discussed importance of regular tooth brushing, flossing, and dental visits.    NEXT PREVENTATIVE PHYSICAL DUE IN 1 YEAR. Return in about 4 weeks (around 12/02/2019) for sleep f/u.

## 2019-11-05 LAB — CBC WITH DIFFERENTIAL/PLATELET
Basophils Absolute: 0 10*3/uL (ref 0.0–0.2)
Basos: 0 %
EOS (ABSOLUTE): 0.2 10*3/uL (ref 0.0–0.4)
Eos: 3 %
Hematocrit: 37.4 % (ref 34.0–46.6)
Hemoglobin: 12.5 g/dL (ref 11.1–15.9)
Immature Grans (Abs): 0 10*3/uL (ref 0.0–0.1)
Immature Granulocytes: 0 %
Lymphocytes Absolute: 1.9 10*3/uL (ref 0.7–3.1)
Lymphs: 28 %
MCH: 29.8 pg (ref 26.6–33.0)
MCHC: 33.4 g/dL (ref 31.5–35.7)
MCV: 89 fL (ref 79–97)
Monocytes Absolute: 0.4 10*3/uL (ref 0.1–0.9)
Monocytes: 5 %
Neutrophils Absolute: 4.2 10*3/uL (ref 1.4–7.0)
Neutrophils: 64 %
Platelets: 373 10*3/uL (ref 150–450)
RBC: 4.19 x10E6/uL (ref 3.77–5.28)
RDW: 13 % (ref 11.7–15.4)
WBC: 6.7 10*3/uL (ref 3.4–10.8)

## 2019-11-05 LAB — COMPREHENSIVE METABOLIC PANEL
ALT: 19 IU/L (ref 0–32)
AST: 18 IU/L (ref 0–40)
Albumin/Globulin Ratio: 2 (ref 1.2–2.2)
Albumin: 4.6 g/dL (ref 3.8–4.9)
Alkaline Phosphatase: 84 IU/L (ref 39–117)
BUN/Creatinine Ratio: 13 (ref 9–23)
BUN: 9 mg/dL (ref 6–24)
Bilirubin Total: <0.2 mg/dL (ref 0.0–1.2)
CO2: 22 mmol/L (ref 20–29)
Calcium: 9.9 mg/dL (ref 8.7–10.2)
Chloride: 103 mmol/L (ref 96–106)
Creatinine, Ser: 0.71 mg/dL (ref 0.57–1.00)
GFR calc Af Amer: 114 mL/min/{1.73_m2} (ref >59)
GFR calc non Af Amer: 99 mL/min/{1.73_m2} (ref >59)
Globulin, Total: 2.3 g/dL (ref 1.5–4.5)
Glucose: 88 mg/dL (ref 65–99)
Potassium: 4.1 mmol/L (ref 3.5–5.2)
Sodium: 139 mmol/L (ref 134–144)
Total Protein: 6.9 g/dL (ref 6.0–8.5)

## 2019-11-05 LAB — LIPID PANEL W/O CHOL/HDL RATIO
Cholesterol, Total: 177 mg/dL (ref 100–199)
HDL: 42 mg/dL (ref >39)
LDL Chol Calc (NIH): 97 mg/dL (ref 0–99)
Triglycerides: 223 mg/dL — ABNORMAL HIGH (ref 0–149)
VLDL Cholesterol Cal: 38 mg/dL (ref 5–40)

## 2019-11-05 LAB — TSH: TSH: 1.65 u[IU]/mL (ref 0.450–4.500)

## 2019-11-15 DIAGNOSIS — G4700 Insomnia, unspecified: Secondary | ICD-10-CM | POA: Insufficient documentation

## 2019-11-15 NOTE — Assessment & Plan Note (Signed)
Stable and under good control, continue current regimen 

## 2019-11-15 NOTE — Assessment & Plan Note (Signed)
Continue prn klonopin, with goal of script with 1 refill lasting 6 months.

## 2019-11-15 NOTE — Assessment & Plan Note (Signed)
Trial seroquel, monitor for improvement. Discussed sleep hygiene

## 2019-11-16 ENCOUNTER — Ambulatory Visit: Payer: Self-pay | Admitting: *Deleted

## 2019-11-16 NOTE — Telephone Encounter (Signed)
Patient has been experiencing twitching since receiving her 1st [employer] vaccine on 10/30/19. Happens during the day and night in her calves/quads/triceps and now her thumbs. Not constantly but through out the day and night. No pain in her calves denies SOB/CP/headaches and no fever.  She is inquiring if you've heard of this and what if anything she should do including should she go ahead with the 2nd vaccine due 11/24/19. Routing to provider for advice.   Answer Assessment - Initial Assessment Questions 1. REASON FOR CALL or QUESTION: "What is your reason for calling today?" or "How can I best help you?" or "What question do you have that I can help answer?"    Patient has been experiencing twitching since her 1st Pfizer vaccine on 10/30/19. 2. CALLER: Document the source of call. (e.g., laboratory, patient).    Patient.  Protocols used: PCP CALL - NO TRIAGE-A-AH

## 2019-11-17 NOTE — Telephone Encounter (Signed)
I would recommend giving things a few more days and seeing if those symptoms go away. I don't see a reason she shouldn't get the second vaccine as long as she's comfortable with it

## 2019-11-17 NOTE — Telephone Encounter (Signed)
LVM with information per DPR.  

## 2019-11-24 ENCOUNTER — Ambulatory Visit: Payer: BC Managed Care – PPO

## 2019-11-28 HISTORY — PX: COLONOSCOPY: SHX174

## 2019-12-01 DIAGNOSIS — Z20822 Contact with and (suspected) exposure to covid-19: Secondary | ICD-10-CM | POA: Diagnosis not present

## 2019-12-01 DIAGNOSIS — Z01812 Encounter for preprocedural laboratory examination: Secondary | ICD-10-CM | POA: Diagnosis not present

## 2019-12-03 DIAGNOSIS — Z1211 Encounter for screening for malignant neoplasm of colon: Secondary | ICD-10-CM | POA: Diagnosis not present

## 2019-12-03 DIAGNOSIS — K635 Polyp of colon: Secondary | ICD-10-CM | POA: Diagnosis not present

## 2019-12-03 DIAGNOSIS — D125 Benign neoplasm of sigmoid colon: Secondary | ICD-10-CM | POA: Diagnosis not present

## 2019-12-03 DIAGNOSIS — K5730 Diverticulosis of large intestine without perforation or abscess without bleeding: Secondary | ICD-10-CM | POA: Diagnosis not present

## 2019-12-03 DIAGNOSIS — D126 Benign neoplasm of colon, unspecified: Secondary | ICD-10-CM | POA: Diagnosis not present

## 2019-12-03 DIAGNOSIS — K640 First degree hemorrhoids: Secondary | ICD-10-CM | POA: Diagnosis not present

## 2019-12-07 ENCOUNTER — Telehealth: Payer: Self-pay

## 2019-12-07 NOTE — Telephone Encounter (Signed)
Called and left a message letting patient know that her labs are stable. Copied from CRM [id number]. Topic: General - Other >> Dec 07, 2019  8:29 AM Jenna James wrote: Reason for CRM: Pt called back because she received a message saying the office will contact her about her lab results/ Pt would like to go over results with the nurse/ please advise

## 2019-12-16 ENCOUNTER — Telehealth: Payer: Self-pay | Admitting: Family Medicine

## 2019-12-16 NOTE — Telephone Encounter (Signed)
The provider who performed her Colonoscopy will be the one to provide her with results for her Colonoscopy. She should call that office and request to go over results

## 2019-12-16 NOTE — Telephone Encounter (Signed)
Copied from CRM [id number]. Topic: General - Other >> Dec 16, 2019 12:12 PM Gwenlyn Fudge wrote: Reason for CRM: Pt called stating that [facility] is supposed to fax over results for the pts colonoscopy from 12/03/19. Pt is requesting to know if the results have been received and is requesting to have a nurse give her a call back to go over the results with her. Please advise.

## 2019-12-16 NOTE — Telephone Encounter (Signed)
Called and LVM for patient to return my call. °

## 2019-12-17 NOTE — Telephone Encounter (Signed)
Patient notified

## 2019-12-21 DIAGNOSIS — L723 Sebaceous cyst: Secondary | ICD-10-CM | POA: Diagnosis not present

## 2019-12-21 DIAGNOSIS — L940 Localized scleroderma [morphea]: Secondary | ICD-10-CM | POA: Diagnosis not present

## 2019-12-21 DIAGNOSIS — L219 Seborrheic dermatitis, unspecified: Secondary | ICD-10-CM | POA: Diagnosis not present

## 2020-01-01 ENCOUNTER — Telehealth: Payer: Self-pay | Admitting: Family Medicine

## 2020-01-01 NOTE — Telephone Encounter (Signed)
I think previous note was saying she won't qualify if she doesn't have diagnosis of HTN. What would you like me to advise patient?

## 2020-01-01 NOTE — Telephone Encounter (Signed)
OK to generate order. To my knowledge she doesn't have either issue

## 2020-01-01 NOTE — Telephone Encounter (Signed)
Copied from CRM [id number]. Topic: General - Inquiry >> Jan 01, 2020  2:01 PM Daphine Deutscher D wrote: Reason for CRM: Pt called asking if she could get a rx for a blood  pressure cuff for preventative care.  She said in order to qualify she cant not have a current diagnosis of HTN or elevated BP.  CP #  [phone]

## 2020-01-04 NOTE — Telephone Encounter (Signed)
No I think she's saying she wants it for preventative. She has had an elevated BP in the past so if that needs to be coded we can use that

## 2020-01-06 NOTE — Telephone Encounter (Signed)
Fax confirmed

## 2020-01-06 NOTE — Telephone Encounter (Signed)
Order faxed to [facility] in [place]. ICD10: RO3.0-Hx of Elevated BP Readings added to Rx.

## 2020-03-31 ENCOUNTER — Ambulatory Visit: Payer: BC Managed Care – PPO | Admitting: Obstetrics and Gynecology

## 2020-03-31 DIAGNOSIS — L905 Scar conditions and fibrosis of skin: Secondary | ICD-10-CM | POA: Diagnosis not present

## 2020-03-31 DIAGNOSIS — D492 Neoplasm of unspecified behavior of bone, soft tissue, and skin: Secondary | ICD-10-CM | POA: Diagnosis not present

## 2020-04-01 ENCOUNTER — Ambulatory Visit: Payer: BC Managed Care – PPO | Admitting: Obstetrics and Gynecology

## 2020-04-05 ENCOUNTER — Encounter: Payer: Self-pay | Admitting: Obstetrics and Gynecology

## 2020-04-05 NOTE — Progress Notes (Signed)
PCP: Particia Nearing, PA-C   Chief Complaint  Patient presents with  . Gynecologic Exam    HPI:      [name] is a 49 y.o. [id number] whose LMP was No LMP recorded. (Menstrual status: IUD)., presents today for her annual examination.  Her menses are absent with IUD. Has occas spotting, no dysmen.   Sex activity: single partner, contraception - IUD. Mirena REplaced 03/18/19 She does not have vaginal dryness.  Last Pap: 03/18/19  Results were: no abnormalities /neg HPV DNA. No hx of abn paps. Feels like mass is at vaginal opening when urinates. No other sx. Hx of uterine prolapse during pregnancy that resolved spontaneously.   Last mammogram: 10/02/19  Results were: normal--routine follow-up in 12 months There is a FH of breast cancer in her PGM, genetic testing not indicated. There is no FH of ovarian cancer. The patient does not do self-breast exams.  Colonoscopy: 2021 with Dr. Roberto Scales at [facility]; Repeat due after 7-10 years. There is a FH of colon cancer in her mat aunt.  Tobacco use: The patient denies current or previous tobacco use. Alcohol use: social drinker  No drug use Exercise: moderately active  She does get adequate calcium but not Vitamin D in her diet.  Labs with PCP.   Past Medical History:  Diagnosis Date  . Anxiety     Past Surgical History:  Procedure Laterality Date  . ANTERIOR CRUCIATE LIGAMENT REPAIR Left    left knee  . BREAST SURGERY     cyst removed  . COLONOSCOPY  11/2019    Family History  Problem Relation Age of Onset  . Colon cancer Maternal Aunt   . Breast cancer Paternal Grandmother        29s?    Social History   Socioeconomic History  . Marital status: Married    Spouse name: Not on file  . Number of children: Not on file  . Years of education: Not on file  . Highest education level: Not on file  Occupational History  . Not on file  Tobacco Use  . Smoking status: Never Smoker  . Smokeless tobacco: Never Used    Vaping Use  . Vaping Use: Never used  Substance and Sexual Activity  . Alcohol use: Yes    Alcohol/week: 1.0 - 2.0 standard drink    Types: 1 - 2 Standard drinks or equivalent per week  . Drug use: No  . Sexual activity: Yes    Birth control/protection: I.U.D.    Comment: Mirena  Other Topics Concern  . Not on file  Social History Narrative  . Not on file   Social Determinants of Health   Financial Resource Strain:   . Difficulty of Paying Living Expenses: Not on file  Food Insecurity:   . Worried About Programme researcher, broadcasting/film/video in the Last Year: Not on file  . Ran Out of Food in the Last Year: Not on file  Transportation Needs:   . Lack of Transportation (Medical): Not on file  . Lack of Transportation (Non-Medical): Not on file  Physical Activity:   . Days of Exercise per Week: Not on file  . Minutes of Exercise per Session: Not on file  Stress:   . Feeling of Stress : Not on file  Social Connections:   . Frequency of Communication with Friends and Family: Not on file  . Frequency of Social Gatherings with Friends and Family: Not on file  . Attends Religious  Services: Not on file  . Active Member of Clubs or Organizations: Not on file  . Attends Banker Meetings: Not on file  . Marital Status: Not on file  Intimate Partner Violence:   . Fear of Current or Ex-Partner: Not on file  . Emotionally Abused: Not on file  . Physically Abused: Not on file  . Sexually Abused: Not on file     Current Outpatient Medications:  .  albuterol (VENTOLIN HFA) 108 (90 Base) MCG/ACT inhaler, Inhale 2 puffs into the lungs every 6 (six) hours as needed for wheezing or shortness of breath., Disp: 18 g, Rfl: 2 .  clonazePAM (KLONOPIN) 0.5 MG tablet, Take 1 tablet (0.5 mg total) by mouth daily as needed for anxiety., Disp: 30 tablet, Rfl: 1 .  cyclobenzaprine (FLEXERIL) 10 MG tablet, Take 1 tablet (10 mg total) by mouth 3 (three) times daily as needed for muscle spasms., Disp: 30  tablet, Rfl: 0 .  levonorgestrel (MIRENA) 20 MCG/24HR IUD, by Intrauterine route., Disp: , Rfl:  .  valACYclovir (VALTREX) 1000 MG tablet, TAKE 1 TABLET(1000 MG) BY MOUTH DAILY, Disp: 30 tablet, Rfl: 2     ROS:  Review of Systems  Constitutional: Negative for fatigue, fever and unexpected weight change.  Respiratory: Negative for cough, shortness of breath and wheezing.   Cardiovascular: Negative for chest pain, palpitations and leg swelling.  Gastrointestinal: Negative for blood in stool, constipation, diarrhea, nausea and vomiting.  Endocrine: Negative for cold intolerance, heat intolerance and polyuria.  Genitourinary: Negative for dyspareunia, dysuria, flank pain, frequency, genital sores, hematuria, menstrual problem, pelvic pain, urgency, vaginal bleeding, vaginal discharge and vaginal pain.  Musculoskeletal: Negative for back pain, joint swelling and myalgias.  Skin: Negative for rash.  Neurological: Negative for dizziness, syncope, light-headedness, numbness and headaches.  Hematological: Negative for adenopathy.  Psychiatric/Behavioral: Negative for agitation, confusion, sleep disturbance and suicidal ideas. The patient is not nervous/anxious.   BREAST: No symptoms    Objective: BP 120/90   Ht 5' 5.5" (1.664 m)   Wt 171 lb (77.6 kg)   BMI 28.02 kg/m    Physical Exam Constitutional:      Appearance: She is well-developed.  Genitourinary:     Vulva, vagina, cervix, uterus, right adnexa and left adnexa normal.     No vulval lesion or tenderness noted.     No vaginal discharge, erythema or tenderness.     No cervical polyp.     IUD strings visualized.     Uterus is not enlarged or tender.     No right or left adnexal mass present.     Right adnexa not tender.     Left adnexa not tender.     Genitourinary Comments: MILD RECTOCELE ON VALSALVA  Neck:     Thyroid: No thyromegaly.  Cardiovascular:     Rate and Rhythm: Normal rate and regular rhythm.     Heart  sounds: Normal heart sounds. No murmur heard.   Pulmonary:     Effort: Pulmonary effort is normal.     Breath sounds: Normal breath sounds.  Chest:     Breasts:        Right: No mass, nipple discharge, skin change or tenderness.        Left: No mass, nipple discharge, skin change or tenderness.  Abdominal:     Palpations: Abdomen is soft.     Tenderness: There is no abdominal tenderness. There is no guarding.  Musculoskeletal:  General: Normal range of motion.     Cervical back: Normal range of motion.  Neurological:     General: No focal deficit present.     Mental Status: She is alert and oriented to person, place, and time.     Cranial Nerves: No cranial nerve deficit.  Skin:    General: Skin is warm and dry.  Psychiatric:        Mood and Affect: Mood normal.        Behavior: Behavior normal.        Thought Content: Thought content normal.        Judgment: Judgment normal.  Vitals reviewed.     Assessment/Plan:  Encounter for annual routine gynecological examination  Encounter for routine checking of intrauterine contraceptive device (IUD); IUD strings in cx os. Expires 8/26.  Encounter for screening mammogram for malignant neoplasm of breast; current on mammo with PCP  Rectocele--mild. Reassurance. Kegels.           GYN counsel menopause, adequate intake of calcium and vitamin D, diet and exercise    F/U  Return in about 1 year (around 04/06/2021).  Ji Feldner B. Ahmir Bracken, PA-C 04/06/2020 11:40 AM

## 2020-04-06 ENCOUNTER — Ambulatory Visit (INDEPENDENT_AMBULATORY_CARE_PROVIDER_SITE_OTHER): Payer: BC Managed Care – PPO | Admitting: Obstetrics and Gynecology

## 2020-04-06 ENCOUNTER — Encounter: Payer: Self-pay | Admitting: Obstetrics and Gynecology

## 2020-04-06 ENCOUNTER — Other Ambulatory Visit: Payer: Self-pay

## 2020-04-06 VITALS — BP 120/90 | Ht 65.5 in | Wt 171.0 lb

## 2020-04-06 DIAGNOSIS — Z01419 Encounter for gynecological examination (general) (routine) without abnormal findings: Secondary | ICD-10-CM | POA: Diagnosis not present

## 2020-04-06 DIAGNOSIS — Z30431 Encounter for routine checking of intrauterine contraceptive device: Secondary | ICD-10-CM

## 2020-04-06 DIAGNOSIS — N816 Rectocele: Secondary | ICD-10-CM

## 2020-04-06 DIAGNOSIS — Z1231 Encounter for screening mammogram for malignant neoplasm of breast: Secondary | ICD-10-CM | POA: Diagnosis not present

## 2020-04-06 NOTE — Patient Instructions (Signed)
I value your feedback and entrusting us with your care. If you get a [facility] patient survey, I would appreciate you taking the time to let us know about your experience today. Thank you!  As of July 09, 2019, your lab results will be released to your MyChart immediately, before I even have a chance to see them. Please give me time to review them and contact you if there are any abnormalities. Thank you for your patience.  

## 2020-06-28 ENCOUNTER — Other Ambulatory Visit: Payer: Self-pay | Admitting: Family Medicine

## 2020-06-28 NOTE — Telephone Encounter (Signed)
Requested medication (s) are due for refill today:  Provider to determine  Requested medication (s) are on the active medication list:   Yes  Future visit scheduled:   No   Last ordered: 11/04/2019 #30, 1 refill  Non delegated refill   Requested Prescriptions  Pending Prescriptions Disp Refills   clonazePAM (KLONOPIN) 0.5 MG tablet [Pharmacy Med Name: CLONAZEPAM 0.5MG  TABLETS] 30 tablet     Sig: TAKE 1 TABLET(0.5 MG) BY MOUTH DAILY AS NEEDED FOR ANXIETY      Not Delegated - Psychiatry:  Anxiolytics/Hypnotics Failed - 06/28/2020  3:17 PM      Failed - This refill cannot be delegated      Failed - Urine Drug Screen completed in last 360 days      Failed - Valid encounter within last 6 months    Recent Outpatient Visits           7 months ago Anxiety   [facility] Particia Nearing, PA-C   1 year ago Seasonal allergic rhinitis due to other allergic trigger   [facility] Particia Nearing, [place]   1 year ago Anxiety   [facility] Roosvelt Maser [place], [place]   2 years ago Lump in throat   [facility] Gabriel Cirri, NP   2 years ago Diarrhea, unspecified type   [facility] [place], [place]

## 2020-06-28 NOTE — Telephone Encounter (Signed)
Please get patient scheduled.  °

## 2020-06-28 NOTE — Telephone Encounter (Signed)
Last seen 11/04/19 NO up coming appts

## 2020-06-28 NOTE — Telephone Encounter (Signed)
Will refuse script, she would need visit first.

## 2020-06-29 NOTE — Telephone Encounter (Signed)
Unable to lvm to make this apt/  

## 2020-06-30 NOTE — Telephone Encounter (Signed)
Pt refused to schedule apt is aware medication can not be filled until apt is scheduled offered multiple apt's pt stated " I will think about it".

## 2020-08-18 ENCOUNTER — Other Ambulatory Visit: Payer: Self-pay | Admitting: Obstetrics and Gynecology

## 2020-08-18 DIAGNOSIS — Z1231 Encounter for screening mammogram for malignant neoplasm of breast: Secondary | ICD-10-CM

## 2020-09-09 ENCOUNTER — Other Ambulatory Visit: Payer: Self-pay | Admitting: Nurse Practitioner

## 2020-09-09 NOTE — Telephone Encounter (Signed)
Medication: clonazePAM (KLONOPIN) 0.5 MG tablet  Has the pt contacted their pharmacy? Yes but Roosvelt Maser last filled  Preferred pharmacy: [id number] - [place], [place] - [address] AT [facility] OF [address]  Please be advised refills may take up to 3 business days.  We ask that you follow up with your pharmacy.

## 2020-09-09 NOTE — Telephone Encounter (Signed)
Requested medication (s) are due for refill today:  no  Requested medication (s) are on the active medication list: yes  Last refill:  11/04/2019  Future visit scheduled: no  Notes to clinic: this refill cannot be delegated   Requested Prescriptions  Pending Prescriptions Disp Refills   clonazePAM (KLONOPIN) 0.5 MG tablet 30 tablet 1    Sig: Take 1 tablet (0.5 mg total) by mouth daily as needed for anxiety.      Not Delegated - Psychiatry:  Anxiolytics/Hypnotics Failed - 09/09/2020  1:08 PM      Failed - This refill cannot be delegated      Failed - Urine Drug Screen completed in last 360 days      Failed - Valid encounter within last 6 months    Recent Outpatient Visits           10 months ago Anxiety   [facility] Particia Nearing, [place]   1 year ago Seasonal allergic rhinitis due to other allergic trigger   [facility] Particia Nearing, [place]   1 year ago Anxiety   [facility] Roosvelt Maser [place], [place]   2 years ago Lump in throat   [facility] Gabriel Cirri, NP   2 years ago Diarrhea, unspecified type   [facility] Roosvelt Maser [place], [place]

## 2020-09-09 NOTE — Telephone Encounter (Signed)
Lvm to make this apt. 

## 2020-09-09 NOTE — Telephone Encounter (Signed)
Patient needs an appointment

## 2020-09-12 ENCOUNTER — Encounter: Payer: Self-pay | Admitting: Obstetrics and Gynecology

## 2020-09-12 ENCOUNTER — Other Ambulatory Visit: Payer: Self-pay | Admitting: Obstetrics and Gynecology

## 2020-09-12 MED ORDER — VALACYCLOVIR HCL 1 G PO TABS: ORAL_TABLET | ORAL | 0 refills | Status: DC

## 2020-09-12 MED ORDER — CLONAZEPAM 0.5 MG PO TABS: 0.5000 mg | ORAL_TABLET | Freq: Every day | ORAL | 1 refills | Status: DC | PRN

## 2020-09-12 NOTE — Progress Notes (Signed)
Rx RF klonopin for anxiety and valtrex for cold sore. PCP retired.

## 2020-09-13 ENCOUNTER — Telehealth: Payer: BC Managed Care – PPO | Admitting: Nurse Practitioner

## 2020-09-13 NOTE — Telephone Encounter (Signed)
Lvm to make this apt. 

## 2020-09-13 NOTE — Telephone Encounter (Signed)
Pt has apt on 09/13/2020

## 2020-10-03 ENCOUNTER — Other Ambulatory Visit: Payer: Self-pay

## 2020-10-03 ENCOUNTER — Ambulatory Visit
Admission: RE | Admit: 2020-10-03 | Discharge: 2020-10-03 | Disposition: A | Discharge status: Home / Self Care | Payer: BC Managed Care – PPO | Source: Ambulatory Visit | Attending: Obstetrics and Gynecology | Admitting: Obstetrics and Gynecology

## 2020-10-03 DIAGNOSIS — Z1231 Encounter for screening mammogram for malignant neoplasm of breast: Secondary | ICD-10-CM

## 2020-10-10 DIAGNOSIS — M5383 Other specified dorsopathies, cervicothoracic region: Secondary | ICD-10-CM | POA: Diagnosis not present

## 2020-10-10 DIAGNOSIS — M9902 Segmental and somatic dysfunction of thoracic region: Secondary | ICD-10-CM | POA: Diagnosis not present

## 2020-10-10 DIAGNOSIS — M9901 Segmental and somatic dysfunction of cervical region: Secondary | ICD-10-CM | POA: Diagnosis not present

## 2020-10-10 DIAGNOSIS — M9903 Segmental and somatic dysfunction of lumbar region: Secondary | ICD-10-CM | POA: Diagnosis not present

## 2020-10-13 DIAGNOSIS — M9902 Segmental and somatic dysfunction of thoracic region: Secondary | ICD-10-CM | POA: Diagnosis not present

## 2020-10-13 DIAGNOSIS — M9901 Segmental and somatic dysfunction of cervical region: Secondary | ICD-10-CM | POA: Diagnosis not present

## 2020-10-13 DIAGNOSIS — M5383 Other specified dorsopathies, cervicothoracic region: Secondary | ICD-10-CM | POA: Diagnosis not present

## 2020-10-18 DIAGNOSIS — M9902 Segmental and somatic dysfunction of thoracic region: Secondary | ICD-10-CM | POA: Diagnosis not present

## 2020-10-18 DIAGNOSIS — M9901 Segmental and somatic dysfunction of cervical region: Secondary | ICD-10-CM | POA: Diagnosis not present

## 2020-10-18 DIAGNOSIS — M5383 Other specified dorsopathies, cervicothoracic region: Secondary | ICD-10-CM | POA: Diagnosis not present

## 2020-12-22 ENCOUNTER — Ambulatory Visit: Payer: Self-pay | Admitting: *Deleted

## 2020-12-22 NOTE — Telephone Encounter (Signed)
Scheduled tomorrow 5/27

## 2020-12-22 NOTE — Telephone Encounter (Signed)
Per agent: "Pt report that she found a tick on her scalp. Her daughter got it out with tweezers. It was still alive and got the head out. Pt reports a headache today. And is wondering should she be concerned? "  Pt reports removed tick from scalp yesterday. Unsure how long had been there "A week, maybe, 3-4 days, just not sure." States daughter removed head "Had blood in it." Asked pt what site looked like. States "Back of head, can't see it right now, feel a little bump."  States tick size of watermelon seed, unsure type. Reports 2/10 headache today, no fever. States she also saw "Squiggly lines while on the computer this morning, brief." Does not check blood pressure. Appt made with Larae Grooms for tomorrow. Care advise given. Pt verbalizes understanding.  Reason for Disposition . [1] 2 to 14 days following tick bite AND [2] widespread rash or headache AND [3] no fever  Answer Assessment - Initial Assessment Questions 1. TYPE of TICK: "Is it a wood tick or a deer tick?" (e.g., deer tick, wood tick; unsure) Unsure 2. SIZE of TICK: "How big is the tick?" (e.g., size of poppy seed, apple seed, watermelon seed; unsure) Note: Deer ticks can be the size of a poppy seed (nymph) or an apple seed (adult).           Watermelon seed 3. ENGORGED: "Did the tick look flat or engorged (full, swollen)?" (e.g., flat, engorged; unsure)    "Did have blood in it." Alive when removed 4. LOCATION: "Where is the tick bite located?"      Scalp 5. ONSET: "How long do you think the tick was attached before you removed it?" (e.g., 5 hours, 2 days)      Unsure. Maybe 3-4 days. 6. APPEARANCE of BITE or RASH: "What does the site look like?"     CAn't see, "Back of head."  Headache today, mild, back of head.Saw "Squiggly lights this AM."  Protocols used: TICK BITE-A-AH

## 2020-12-23 ENCOUNTER — Ambulatory Visit: Payer: Self-pay | Admitting: Nurse Practitioner

## 2020-12-23 NOTE — Progress Notes (Deleted)
There were no vitals taken for this visit.   Subjective:    Patient ID: Jenna James, female    DOB: 1970/11/16, 50 y.o.   MRN: [id number]  HPI: Jenna James is a 50 y.o. female  No chief complaint on file.  TICK BITE   Relevant past medical, surgical, family and social history reviewed and updated as indicated. Interim medical history since our last visit reviewed. Allergies and medications reviewed and updated.  Review of Systems  Per HPI unless specifically indicated above     Objective:    There were no vitals taken for this visit.  Wt Readings from Last 3 Encounters:  04/06/20 171 lb (77.6 kg)  11/04/19 174 lb (78.9 kg)  06/05/19 165 lb 12.8 oz (75.2 kg)    Physical Exam  Results for orders placed or performed in visit on 11/04/19  Microscopic Examination   URINE  Result Value Ref Range   WBC, UA None seen 0 - 5 /hpf   RBC 3-10 (A) 0 - 2 /hpf   Epithelial Cells (non renal) 0-10 0 - 10 /hpf   Bacteria, UA None seen None seen/Few  CBC with Differential/Platelet  Result Value Ref Range   WBC 6.7 3.4 - 10.8 x10E3/uL   RBC 4.19 3.77 - 5.28 x10E6/uL   Hemoglobin 12.5 11.1 - 15.9 g/dL   Hematocrit 61.9 50.9 - 46.6 %   MCV 89 79 - 97 fL   MCH 29.8 26.6 - 33.0 pg   MCHC 33.4 31.5 - 35.7 g/dL   RDW 32.6 71.2 - 45.8 %   Platelets 373 150 - 450 x10E3/uL   Neutrophils 64 Not Estab. %   Lymphs 28 Not Estab. %   Monocytes 5 Not Estab. %   Eos 3 Not Estab. %   Basos 0 Not Estab. %   Neutrophils Absolute 4.2 1.4 - 7.0 x10E3/uL   Lymphocytes Absolute 1.9 0.7 - 3.1 x10E3/uL   Monocytes Absolute 0.4 0.1 - 0.9 x10E3/uL   EOS (ABSOLUTE) 0.2 0.0 - 0.4 x10E3/uL   Basophils Absolute 0.0 0.0 - 0.2 x10E3/uL   Immature Granulocytes 0 Not Estab. %   Immature Grans (Abs) 0.0 0.0 - 0.1 x10E3/uL  Comprehensive metabolic panel  Result Value Ref Range   Glucose 88 65 - 99 mg/dL   BUN 9 6 - 24 mg/dL   Creatinine, Ser 0.99 0.57 - 1.00 mg/dL   GFR calc non Af Amer 99 >59  mL/min/1.73   GFR calc Af Amer 114 >59 mL/min/1.73   BUN/Creatinine Ratio 13 9 - 23   Sodium 139 134 - 144 mmol/L   Potassium 4.1 3.5 - 5.2 mmol/L   Chloride 103 96 - 106 mmol/L   CO2 22 20 - 29 mmol/L   Calcium 9.9 8.7 - 10.2 mg/dL   Total Protein 6.9 6.0 - 8.5 g/dL   Albumin 4.6 3.8 - 4.9 g/dL   Globulin, Total 2.3 1.5 - 4.5 g/dL   Albumin/Globulin Ratio 2.0 1.2 - 2.2   Bilirubin Total <0.2 0.0 - 1.2 mg/dL   Alkaline Phosphatase 84 39 - 117 IU/L   AST 18 0 - 40 IU/L   ALT 19 0 - 32 IU/L  Lipid Panel w/o Chol/HDL Ratio  Result Value Ref Range   Cholesterol, Total 177 100 - 199 mg/dL   Triglycerides 833 (H) 0 - 149 mg/dL   HDL 42 >82 mg/dL   VLDL Cholesterol Cal 38 5 - 40 mg/dL   LDL Chol Calc (NIH) 97 0 -  99 mg/dL  UA/M w/rflx Culture, Routine   Specimen: Urine   URINE  Result Value Ref Range   Specific Gravity, UA 1.020 1.005 - 1.030   pH, UA 6.0 5.0 - 7.5   Color, UA Yellow Yellow   Appearance Ur Clear Clear   Leukocytes,UA Negative Negative   Protein,UA Negative Negative/Trace   Glucose, UA Negative Negative   Ketones, UA Negative Negative   RBC, UA 1+ (A) Negative   Bilirubin, UA Negative Negative   Urobilinogen, Ur 0.2 0.2 - 1.0 mg/dL   Nitrite, UA Negative Negative   Microscopic Examination See below:   TSH  Result Value Ref Range   TSH 1.650 0.450 - 4.500 uIU/mL      Assessment & Plan:   Problem List Items Addressed This Visit   None      Follow up plan: No follow-ups on file.

## 2021-03-09 IMAGING — MG DIGITAL SCREENING BILAT W/ TOMO W/ CAD
8 series · 8 of 24 positions shown · non-contrast
Comparison: Previous exam(s).

CLINICAL DATA: Screening.

EXAM:
DIGITAL SCREENING BILATERAL MAMMOGRAM WITH TOMO AND CAD

[R CC synth-2D]
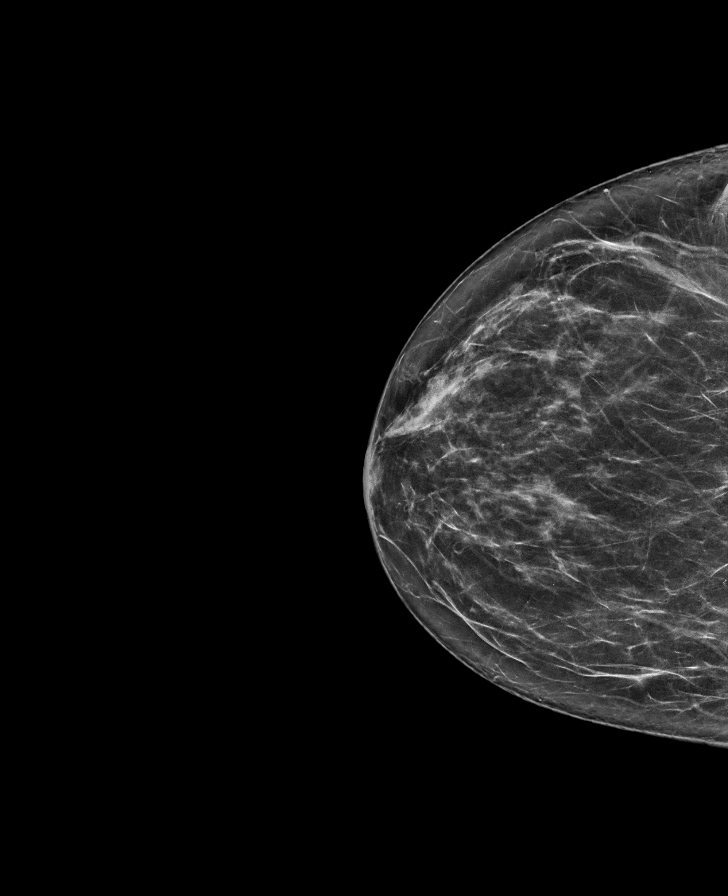

[L CC synth-2D]
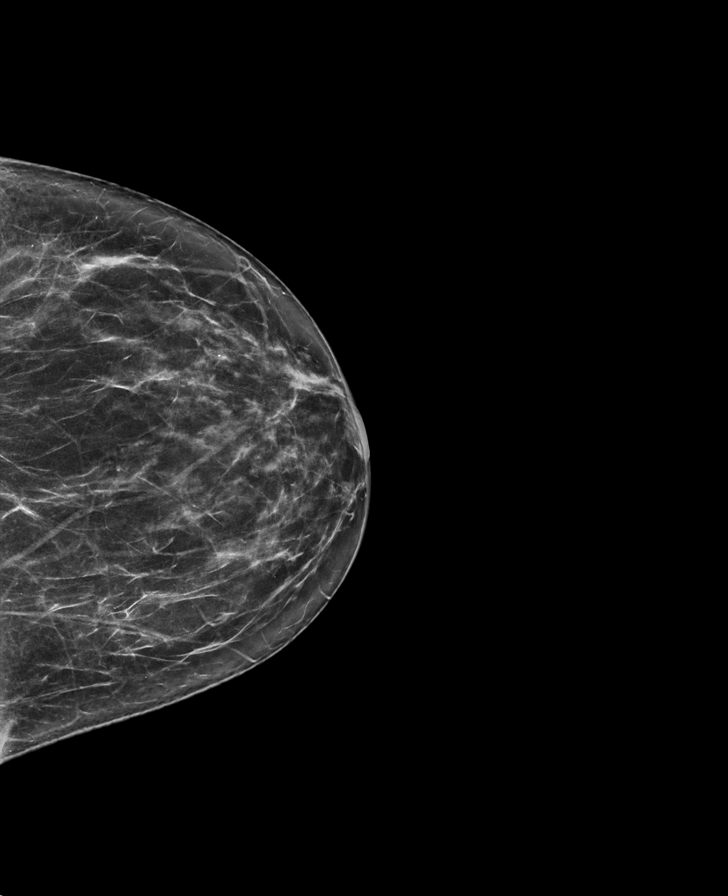

[L MLO synth-2D]
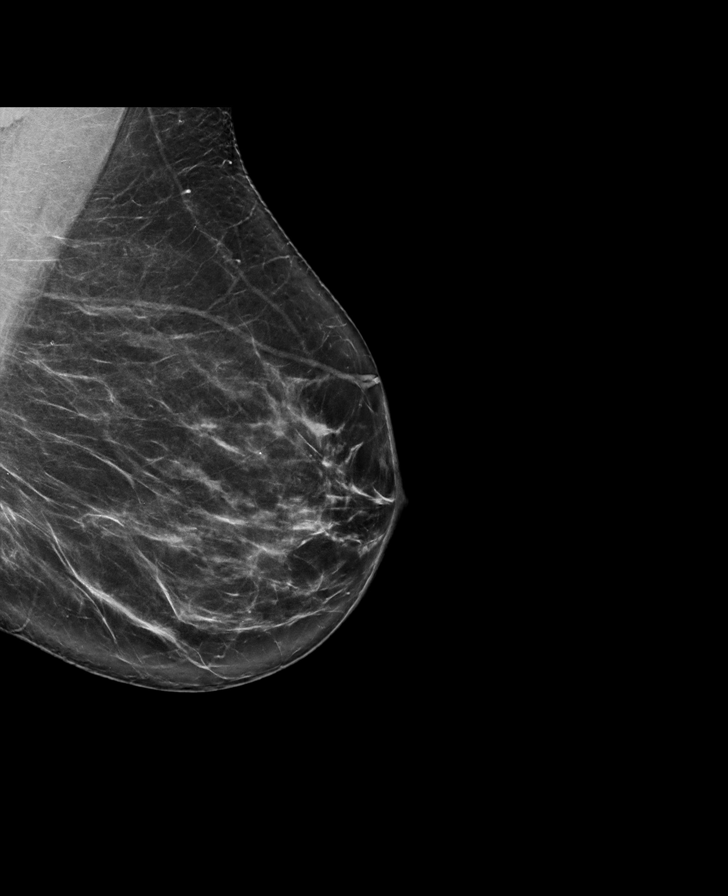

[R MLO synth-2D]
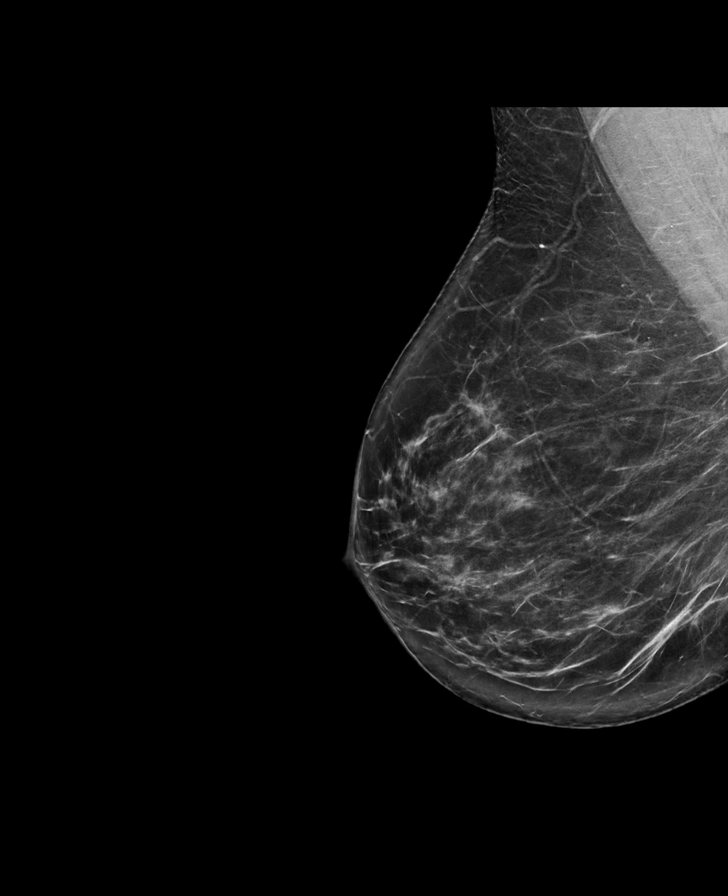

[L CC tomo · tomo slice 35/68.0]
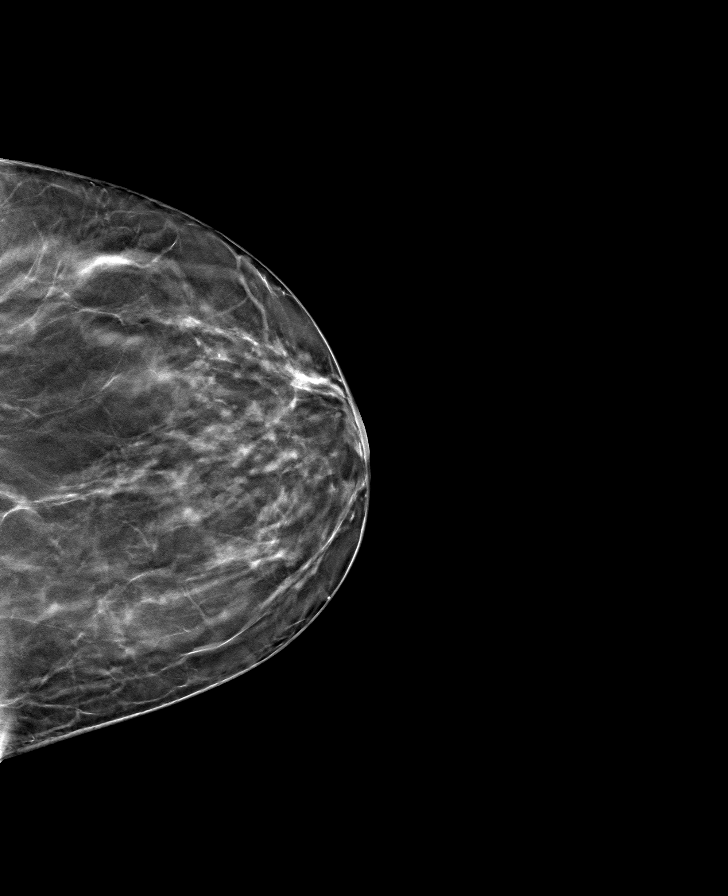

[R MLO tomo · tomo slice 41/81.0]
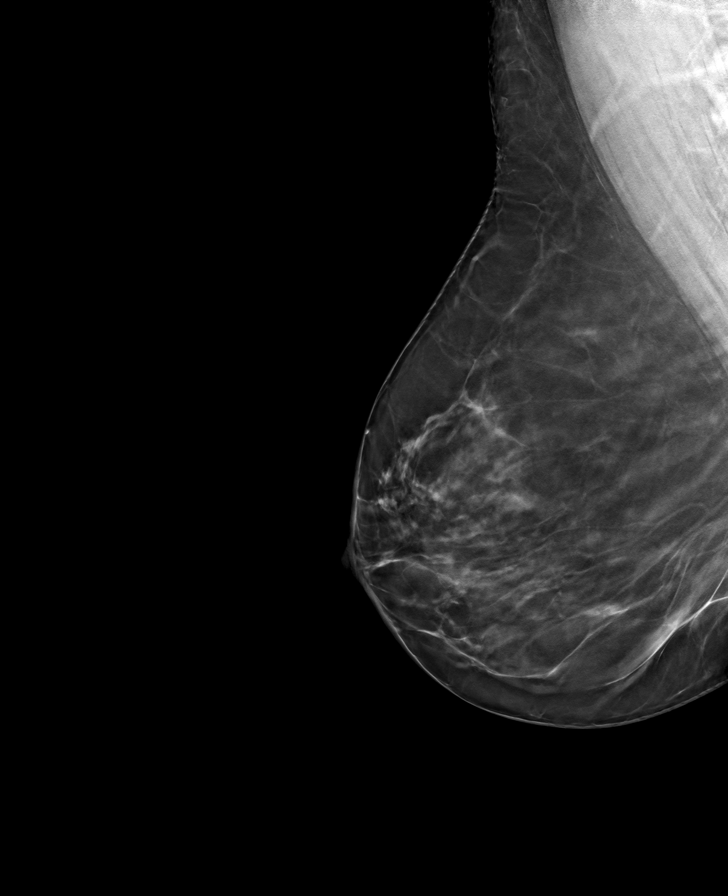

[L MLO tomo · tomo slice 41/80.0]
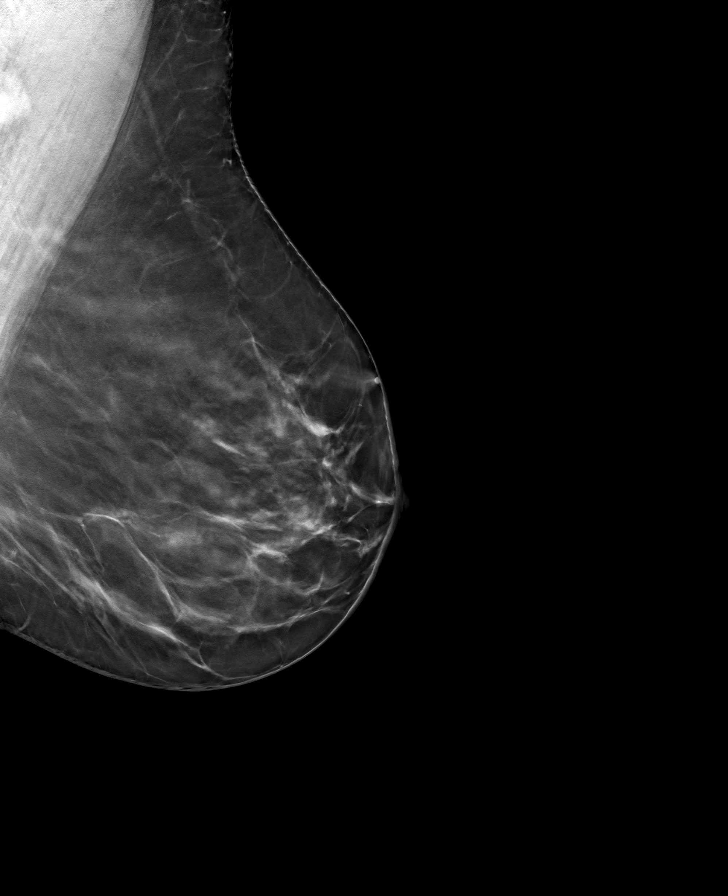

[R CC tomo · tomo slice 35/69.0]
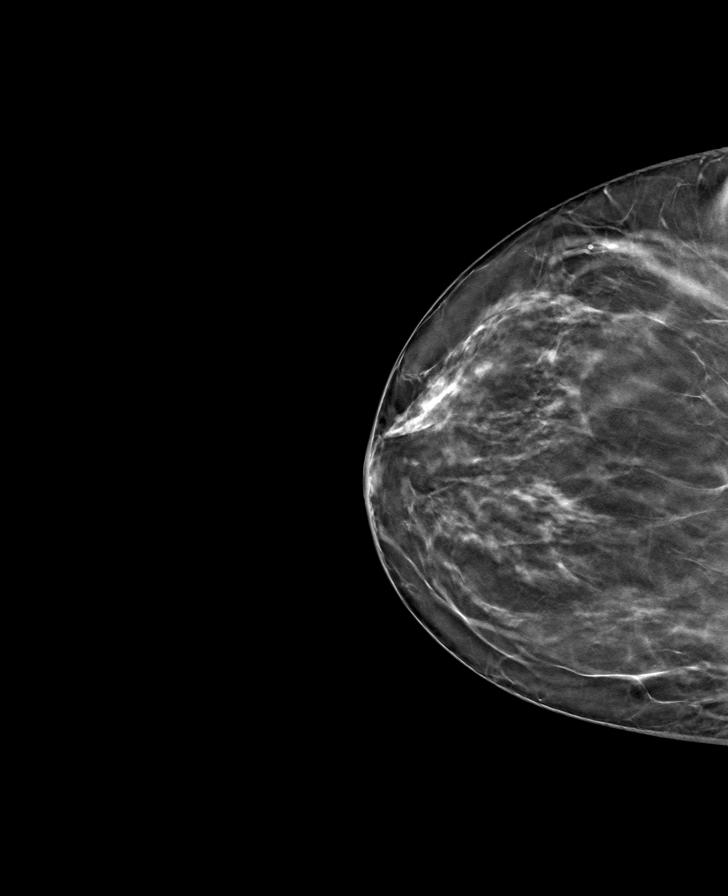

[8 of 24 positions shown; findings below may reference images not displayed]

ACR Breast Density Category b: There are scattered areas of
fibroglandular density.
FINDINGS: There are no findings suspicious for malignancy. Images were
processed with CAD.
IMPRESSION: No mammographic evidence of malignancy. A result letter of this
screening mammogram will be mailed directly to the patient.

RECOMMENDATION:
Screening mammogram in one year. (Code:[id number])

BI-RADS CATEGORY  1: Negative.

## 2021-05-03 NOTE — Telephone Encounter (Signed)
Mirena rcvd/charged 03/18/2019

## 2021-05-12 ENCOUNTER — Ambulatory Visit: Payer: BC Managed Care – PPO | Admitting: Obstetrics and Gynecology

## 2021-05-18 ENCOUNTER — Other Ambulatory Visit: Payer: Self-pay | Admitting: Obstetrics and Gynecology

## 2021-06-08 ENCOUNTER — Encounter: Payer: Self-pay | Admitting: Obstetrics and Gynecology

## 2021-06-08 ENCOUNTER — Other Ambulatory Visit: Payer: Self-pay

## 2021-06-08 ENCOUNTER — Ambulatory Visit (INDEPENDENT_AMBULATORY_CARE_PROVIDER_SITE_OTHER): Payer: BC Managed Care – PPO | Admitting: Obstetrics and Gynecology

## 2021-06-08 VITALS — BP 120/64 | Ht 65.5 in | Wt 175.0 lb

## 2021-06-08 DIAGNOSIS — Z30431 Encounter for routine checking of intrauterine contraceptive device: Secondary | ICD-10-CM

## 2021-06-08 DIAGNOSIS — B001 Herpesviral vesicular dermatitis: Secondary | ICD-10-CM | POA: Insufficient documentation

## 2021-06-08 DIAGNOSIS — Z0000 Encounter for general adult medical examination without abnormal findings: Secondary | ICD-10-CM

## 2021-06-08 DIAGNOSIS — Z01419 Encounter for gynecological examination (general) (routine) without abnormal findings: Secondary | ICD-10-CM | POA: Diagnosis not present

## 2021-06-08 DIAGNOSIS — Z1231 Encounter for screening mammogram for malignant neoplasm of breast: Secondary | ICD-10-CM | POA: Diagnosis not present

## 2021-06-08 DIAGNOSIS — E781 Pure hyperglyceridemia: Secondary | ICD-10-CM | POA: Diagnosis not present

## 2021-06-08 DIAGNOSIS — Z13220 Encounter for screening for lipoid disorders: Secondary | ICD-10-CM

## 2021-06-08 MED ORDER — VALACYCLOVIR HCL 1 G PO TABS: ORAL_TABLET | ORAL | 0 refills | Status: AC

## 2021-06-08 NOTE — Patient Instructions (Signed)
I value your feedback and you entrusting us with your care. If you get a [facility] patient survey, I would appreciate you taking the time to let us know about your experience today. Thank you!  [facility]: [phone]      

## 2021-06-08 NOTE — Progress Notes (Signed)
PCP: Jon Billings, NP   Chief Complaint  Patient presents with   Gynecologic Exam    No concerns    HPI:      Jenna James is a 50 y.o. [phone] whose LMP was No LMP recorded. (Menstrual status: IUD)., presents today for her annual examination.  Her menses are almost monthly now with IUD, lasting a few days, light flow, no BTB, no dysmen. Used to be amenorrheic.  Sex activity: single partner, contraception - IUD. Mirena REplaced 03/18/19 She uses lubricants prn, no pain/bleeding.  Last Pap: 03/18/19  Results were: no abnormalities /neg HPV DNA. No hx of abn paps.  Last mammogram: 10/03/20  Results were: normal--routine follow-up in 12 months There is a FH of breast cancer in her PGM, genetic testing not indicated. There is no FH of ovarian cancer. There is a FH of non-metastatic prostate cancer in her dad. The patient does not do self-breast exams.  Colonoscopy: 2021 with Dr. Jacquiline Doe at [facility]; Repeat due after 7-10 years. There is a FH of colon cancer in her mat aunt.  Tobacco use: The patient denies current or previous tobacco use. Alcohol use: social drinker  No drug use Exercise: very active  She does get adequate calcium but not Vitamin D in her diet.  Labs with PCP in 4/21 with elevated TG. Due for repeat labs.   Past Medical History:  Diagnosis Date   Anxiety     Past Surgical History:  Procedure Laterality Date   ANTERIOR CRUCIATE LIGAMENT REPAIR Left    left knee   BREAST SURGERY     cyst removed   COLONOSCOPY  11/2019    Family History  Problem Relation Age of Onset   Prostate cancer Father    Colon cancer Maternal Aunt    Breast cancer Paternal Grandmother        44s?    Social History   Socioeconomic History   Marital status: Married    Spouse name: Not on file   Number of children: Not on file   Years of education: Not on file   Highest education level: Not on file  Occupational History   Not on file  Tobacco Use   Smoking status:  Never   Smokeless tobacco: Never  Vaping Use   Vaping Use: Never used  Substance and Sexual Activity   Alcohol use: Yes    Alcohol/week: 1.0 - 2.0 standard drink    Types: 1 - 2 Standard drinks or equivalent per week   Drug use: No   Sexual activity: Yes    Birth control/protection: I.U.D.    Comment: Mirena  Other Topics Concern   Not on file  Social History Narrative   Not on file   Social Determinants of Health   Financial Resource Strain: Not on file  Food Insecurity: Not on file  Transportation Needs: Not on file  Physical Activity: Not on file  Stress: Not on file  Social Connections: Not on file  Intimate Partner Violence: Not on file     Current Outpatient Medications:    albuterol (VENTOLIN HFA) 108 (90 Base) MCG/ACT inhaler, Inhale 2 puffs into the lungs every 6 (six) hours as needed for wheezing or shortness of breath., Disp: 18 g, Rfl: 2   clonazePAM (KLONOPIN) 0.5 MG tablet, TAKE 1 TABLET(0.5 MG) BY MOUTH DAILY AS NEEDED FOR ANXIETY, Disp: 30 tablet, Rfl: 0   levonorgestrel (MIRENA) 20 MCG/24HR IUD, by Intrauterine route., Disp: , Rfl:  valACYclovir (VALTREX) 1000 MG tablet, TAKE 2 TABLET(1000 MG) BY MOUTH BID for 1 day prn sx, Disp: 30 tablet, Rfl: 0     ROS:  Review of Systems  Constitutional:  Negative for fatigue, fever and unexpected weight change.  Respiratory:  Negative for cough, shortness of breath and wheezing.   Cardiovascular:  Negative for chest pain, palpitations and leg swelling.  Gastrointestinal:  Negative for blood in stool, constipation, diarrhea, nausea and vomiting.  Endocrine: Negative for cold intolerance, heat intolerance and polyuria.  Genitourinary:  Negative for dyspareunia, dysuria, flank pain, frequency, genital sores, hematuria, menstrual problem, pelvic pain, urgency, vaginal bleeding, vaginal discharge and vaginal pain.  Musculoskeletal:  Negative for back pain, joint swelling and myalgias.  Skin:  Negative for rash.   Neurological:  Negative for dizziness, syncope, light-headedness, numbness and headaches.  Hematological:  Negative for adenopathy.  Psychiatric/Behavioral:  Negative for agitation, confusion, sleep disturbance and suicidal ideas. The patient is not nervous/anxious.  BREAST: No symptoms    Objective: BP 120/64   Ht 5' 5.5" (1.664 m)   Wt 175 lb (79.4 kg)   BMI 28.68 kg/m    Physical Exam Constitutional:      Appearance: She is well-developed.  Genitourinary:     Vulva normal.     Genitourinary Comments: MILD RECTOCELE ON VALSALVA     Right Labia: No rash, tenderness or lesions.    Left Labia: No tenderness, lesions or rash.    No vaginal discharge, erythema or tenderness.      Right Adnexa: not tender and no mass present.    Left Adnexa: not tender and no mass present.    No cervical friability or polyp.     IUD strings visualized.     Uterus is not enlarged or tender.  Breasts:    Right: No mass, nipple discharge, skin change or tenderness.     Left: No mass, nipple discharge, skin change or tenderness.  Neck:     Thyroid: No thyromegaly.  Cardiovascular:     Rate and Rhythm: Normal rate and regular rhythm.     Heart sounds: Normal heart sounds. No murmur heard. Pulmonary:     Effort: Pulmonary effort is normal.     Breath sounds: Normal breath sounds.  Abdominal:     Palpations: Abdomen is soft.     Tenderness: There is no abdominal tenderness. There is no guarding or rebound.  Musculoskeletal:        General: Normal range of motion.     Cervical back: Normal range of motion.  Lymphadenopathy:     Cervical: No cervical adenopathy.  Neurological:     General: No focal deficit present.     Mental Status: She is alert and oriented to person, place, and time.     Cranial Nerves: No cranial nerve deficit.  Skin:    General: Skin is warm and dry.  Psychiatric:        Mood and Affect: Mood normal.        Behavior: Behavior normal.        Thought Content: Thought  content normal.        Judgment: Judgment normal.  Vitals reviewed.    Assessment/Plan: Encounter for annual routine gynecological examination  Encounter for routine checking of intrauterine contraceptive device (IUD)--IUD strings in cx os. Has 8 yr indication now.  Encounter for screening mammogram for malignant neoplasm of breast - Plan: MM 3D SCREEN BREAST BILATERAL; pt to sched mammo  Blood tests for routine  general physical examination - Plan: Lipid panel, Comprehensive metabolic panel  Hypertriglyceridemia - Plan: Lipid panel; repeat labs today.   Screening cholesterol level - Plan: Lipid panel  Cold sore - Plan: valACYclovir (VALTREX) 1000 MG tablet; Rx RF.   Meds ordered this encounter  Medications   valACYclovir (VALTREX) 1000 MG tablet    Sig: TAKE 2 TABLET(1000 MG) BY MOUTH BID for 1 day prn sx    Dispense:  30 tablet    Refill:  0    Order Specific Question:   Supervising Provider    Answer:   Nadara Mustard [[id number]]            GYN counsel adequate intake of calcium and vitamin D, diet and exercise    F/U  Return in about 1 year (around 06/08/2022).  Debbie Yearick B. Georgios Kina, PA-C 06/08/2021 12:02 PM

## 2021-06-09 ENCOUNTER — Encounter: Payer: Self-pay | Admitting: Obstetrics and Gynecology

## 2021-06-09 LAB — COMPREHENSIVE METABOLIC PANEL
ALT: 19 IU/L (ref 0–32)
AST: 15 IU/L (ref 0–40)
Albumin/Globulin Ratio: 2 (ref 1.2–2.2)
Albumin: 4.7 g/dL (ref 3.8–4.9)
Alkaline Phosphatase: 82 IU/L (ref 44–121)
BUN/Creatinine Ratio: 13 (ref 9–23)
BUN: 9 mg/dL (ref 6–24)
Bilirubin Total: 0.4 mg/dL (ref 0.0–1.2)
CO2: 22 mmol/L (ref 20–29)
Calcium: 9.8 mg/dL (ref 8.7–10.2)
Chloride: 101 mmol/L (ref 96–106)
Creatinine, Ser: 0.7 mg/dL (ref 0.57–1.00)
Globulin, Total: 2.3 g/dL (ref 1.5–4.5)
Glucose: 97 mg/dL (ref 70–99)
Potassium: 4.6 mmol/L (ref 3.5–5.2)
Sodium: 139 mmol/L (ref 134–144)
Total Protein: 7 g/dL (ref 6.0–8.5)
eGFR: 104 mL/min/{1.73_m2} (ref >59)

## 2021-06-09 LAB — LIPID PANEL
Chol/HDL Ratio: 3.5 ratio (ref 0.0–4.4)
Cholesterol, Total: 188 mg/dL (ref 100–199)
HDL: 53 mg/dL (ref >39)
LDL Chol Calc (NIH): 111 mg/dL — ABNORMAL HIGH (ref 0–99)
Triglycerides: 134 mg/dL (ref 0–149)
VLDL Cholesterol Cal: 24 mg/dL (ref 5–40)

## 2021-07-25 ENCOUNTER — Encounter: Payer: Self-pay | Admitting: Obstetrics and Gynecology

## 2021-07-25 ENCOUNTER — Encounter: Payer: Self-pay | Admitting: Nurse Practitioner

## 2021-07-27 ENCOUNTER — Telehealth (INDEPENDENT_AMBULATORY_CARE_PROVIDER_SITE_OTHER): Payer: BC Managed Care – PPO | Admitting: Internal Medicine

## 2021-07-27 ENCOUNTER — Encounter: Payer: Self-pay | Admitting: Internal Medicine

## 2021-07-27 DIAGNOSIS — J069 Acute upper respiratory infection, unspecified: Secondary | ICD-10-CM

## 2021-07-27 DIAGNOSIS — U071 COVID-19: Secondary | ICD-10-CM | POA: Diagnosis not present

## 2021-07-27 DIAGNOSIS — B001 Herpesviral vesicular dermatitis: Secondary | ICD-10-CM | POA: Diagnosis not present

## 2021-07-27 MED ORDER — FEXOFENADINE HCL 180 MG PO TABS: 180.0000 mg | ORAL_TABLET | Freq: Every day | ORAL | 1 refills | Status: DC

## 2021-07-27 MED ORDER — METHYLPREDNISOLONE 4 MG PO TBPK: ORAL_TABLET | ORAL | 0 refills | Status: DC

## 2021-07-27 NOTE — Progress Notes (Signed)
There were no vitals taken for this visit.   Subjective:    Patient ID: Jenna James, female    DOB: Jul 02, 1971, 50 y.o.   MRN: [id number]  No chief complaint on file.   HPI: Jenna James is a 50 y.o. female   This visit was completed via video visit through MyChart due to the restrictions of the COVID-19 pandemic. All issues as above were discussed and addressed. Physical exam was done as above through visual confirmation on video through MyChart. If it was felt that the patient should be evaluated in the office, they were directed there. The patient verbally consented to this visit. Location of the patient: home Location of the provider: work Those involved with this call:  Provider: Charlynne Cousins, MD CMA: Frazier Butt, [place] Desk/Registration: [employer]  Time spent on call: 10 minutes with patient face to face via video conference. More than 50% of this time was spent in counseling and coordination of care. 10 minutes total spent in review of patient's record and preparation of their chart.  Patient presents with: Covid Positive: Symptoms started on 12/26 with sore throat     URI  This is a new (had a sore throat on the 26th and checked home COVID test which wsa +Ve. 3 pm started feelkng bad - has been feeling cold, congestion in chest started yesterday , no watery) problem. The current episode started 1 to 4 weeks ago (has had wheezing.). The problem has been waxing and waning. There has been no fever. Associated symptoms include congestion and a sore throat. Pertinent negatives include no coughing, diarrhea, dysuria, ear pain, joint swelling, nausea, neck pain, plugged ear sensation, rash, sneezing, swollen glands or wheezing.   No chief complaint on file.   Relevant past medical, surgical, family and social history reviewed and updated as indicated. Interim medical history since our last visit reviewed. Allergies and medications reviewed and updated.  Review of  Systems  HENT:  Positive for congestion and sore throat. Negative for ear pain and sneezing.   Respiratory:  Negative for cough and wheezing.   Gastrointestinal:  Negative for diarrhea and nausea.  Genitourinary:  Negative for dysuria.  Musculoskeletal:  Negative for neck pain.  Skin:  Negative for rash.   Per HPI unless specifically indicated above     Objective:    There were no vitals taken for this visit.  Wt Readings from Last 3 Encounters:  06/08/21 175 lb (79.4 kg)  04/06/20 171 lb (77.6 kg)  11/04/19 174 lb (78.9 kg)    Physical Exam  Unable to peform sec to virtual visit.   Results for orders placed or performed in visit on 06/08/21  Lipid panel  Result Value Ref Range   Cholesterol, Total 188 100 - 199 mg/dL   Triglycerides 134 0 - 149 mg/dL   HDL 53 >39 mg/dL   VLDL Cholesterol Cal 24 5 - 40 mg/dL   LDL Chol Calc (NIH) 111 (H) 0 - 99 mg/dL   Chol/HDL Ratio 3.5 0.0 - 4.4 ratio  Comprehensive metabolic panel  Result Value Ref Range   Glucose 97 70 - 99 mg/dL   BUN 9 6 - 24 mg/dL   Creatinine, Ser 0.70 0.57 - 1.00 mg/dL   eGFR 104 >59 mL/min/1.73   BUN/Creatinine Ratio 13 9 - 23   Sodium 139 134 - 144 mmol/L   Potassium 4.6 3.5 - 5.2 mmol/L   Chloride 101 96 - 106 mmol/L   CO2 22 20 -  29 mmol/L   Calcium 9.8 8.7 - 10.2 mg/dL   Total Protein 7.0 6.0 - 8.5 g/dL   Albumin 4.7 3.8 - 4.9 g/dL   Globulin, Total 2.3 1.5 - 4.5 g/dL   Albumin/Globulin Ratio 2.0 1.2 - 2.2   Bilirubin Total 0.4 0.0 - 1.2 mg/dL   Alkaline Phosphatase 82 44 - 121 IU/L   AST 15 0 - 40 IU/L   ALT 19 0 - 32 IU/L        Current Outpatient Medications:    albuterol (VENTOLIN HFA) 108 (90 Base) MCG/ACT inhaler, Inhale 2 puffs into the lungs every 6 (six) hours as needed for wheezing or shortness of breath., Disp: 18 g, Rfl: 2   clonazePAM (KLONOPIN) 0.5 MG tablet, TAKE 1 TABLET(0.5 MG) BY MOUTH DAILY AS NEEDED FOR ANXIETY, Disp: 30 tablet, Rfl: 0   levonorgestrel (MIRENA) 20 MCG/24HR  IUD, by Intrauterine route., Disp: , Rfl:    valACYclovir (VALTREX) 1000 MG tablet, TAKE 2 TABLET(1000 MG) BY MOUTH BID for 1 day prn sx, Disp: 30 tablet, Rfl: 0    Assessment & Plan:  COVID : positive : Increase fluid intake.  Headahce - tyelnol every 4-6 hrs prn and alternate this with ibubrufen 800 mg q 8 hrly.  Sinus pressure: use steam inhalation.  OTC -  Allegra / claritin.  Pt verbalized understanding of such.  Problem List Items Addressed This Visit   None    No orders of the defined types were placed in this encounter.    Meds ordered this encounter  Medications   methylPREDNISolone (MEDROL DOSEPAK) 4 MG TBPK tablet    Sig: Use as directed    Dispense:  1 each    Refill:  0   fexofenadine (ALLEGRA ALLERGY) 180 MG tablet    Sig: Take 1 tablet (180 mg total) by mouth daily.    Dispense:  10 tablet    Refill:  1     Follow up plan: No follow-ups on file.

## 2021-08-11 ENCOUNTER — Telehealth: Payer: Self-pay

## 2021-08-11 NOTE — Telephone Encounter (Signed)
Pt calling; saw ABC recently for annual; has received two bills from  [facility] for Lipid panel and CMP; insurance told her is wasn't coded as preventative; is there a way to fix it?  [phone]

## 2021-08-15 ENCOUNTER — Encounter: Payer: Self-pay | Admitting: Internal Medicine

## 2021-08-15 DIAGNOSIS — J069 Acute upper respiratory infection, unspecified: Secondary | ICD-10-CM | POA: Insufficient documentation

## 2021-08-15 DIAGNOSIS — U071 COVID-19: Secondary | ICD-10-CM | POA: Insufficient documentation

## 2021-08-15 NOTE — Telephone Encounter (Signed)
Pls let pt know I'll look into this with [facility]. She did have high triglycerides last year so it was done for a reason and not just preventive.

## 2021-08-15 NOTE — Telephone Encounter (Signed)
Detailed message left

## 2021-08-18 NOTE — Telephone Encounter (Signed)
Spoke with Orwin with [facility]. She will call billing to get more info on codes and if we can remove dx code of hypertriglyceridemia, if not fraud.

## 2021-08-24 NOTE — Telephone Encounter (Signed)
Pls let pt know lipid panel was done due to high triglycerides and screening so coded correctly. I don't know if charge can be adjusted. With that said, since it was normal this year, lipid doesn't need to be done again next year.

## 2021-08-24 NOTE — Telephone Encounter (Signed)
Pt aware.

## 2021-10-23 ENCOUNTER — Other Ambulatory Visit: Payer: Self-pay | Admitting: Obstetrics and Gynecology

## 2021-11-22 ENCOUNTER — Telehealth: Payer: Self-pay | Admitting: Nurse Practitioner

## 2021-11-22 ENCOUNTER — Ambulatory Visit (INDEPENDENT_AMBULATORY_CARE_PROVIDER_SITE_OTHER): Payer: BC Managed Care – PPO | Admitting: Internal Medicine

## 2021-11-22 ENCOUNTER — Encounter: Payer: Self-pay | Admitting: Internal Medicine

## 2021-11-22 VITALS — BP 155/89 | HR 87 | Temp 99.1°F | Ht 65.51 in | Wt 173.6 lb

## 2021-11-22 DIAGNOSIS — J069 Acute upper respiratory infection, unspecified: Secondary | ICD-10-CM

## 2021-11-22 DIAGNOSIS — R062 Wheezing: Secondary | ICD-10-CM | POA: Diagnosis not present

## 2021-11-22 MED ORDER — AZITHROMYCIN 250 MG PO TABS: ORAL_TABLET | ORAL | 0 refills | Status: AC

## 2021-11-22 MED ORDER — ALBUTEROL SULFATE (2.5 MG/3ML) 0.083% IN NEBU
2.5000 mg | INHALATION_SOLUTION | Freq: Once | RESPIRATORY_TRACT | Status: AC
  Administered 2021-11-22: 2.5 mg via RESPIRATORY_TRACT

## 2021-11-22 MED ORDER — METHYLPREDNISOLONE SODIUM SUCC 40 MG IJ SOLR
120.0000 mg | Freq: Once | INTRAMUSCULAR | Status: AC
  Administered 2021-11-22: 120 mg via INTRAMUSCULAR

## 2021-11-22 NOTE — Progress Notes (Signed)
? ?BP (!) 155/89   Pulse 87   Temp 99.1 ?F (37.3 ?C) (Oral)   Ht 5' 5.51" (1.664 m)   Wt 173 lb 9.6 oz (78.7 kg)   SpO2 97%   BMI 28.44 kg/m?   ? ?Subjective:  ? ? Patient ID: Jenna James, female    DOB: 09/02/1970, 51 y.o.   MRN: [id number] ? ?Chief Complaint  ?Patient presents with  ?? Cough  ?  Patient states that her chest was rattling last night, that her throat is swollen, ear ache, sinus drainage, and throat burning. Started on Sunday  ? ? ?HPI: ?Jenna James is a 51 y.o. female ? ?Patient presents with: ?Cough: Patient states that her chest was rattling last night, that her throat is swollen, ear ache, sinus drainage, and throat burning. Started on Sunday ? ? ? ?Cough ?This is a new problem. Associated symptoms include wheezing.  ?Wheezing  ?This is a new problem. The current episode started yesterday. Associated symptoms include coughing.  ? ?Chief Complaint  ?Patient presents with  ?? Cough  ?  Patient states that her chest was rattling last night, that her throat is swollen, ear ache, sinus drainage, and throat burning. Started on Sunday  ? ? ?Relevant past medical, surgical, family and social history reviewed and updated as indicated. Interim medical history since our last visit reviewed. ?Allergies and medications reviewed and updated. ? ?Review of Systems  ?Respiratory:  Positive for cough and wheezing.   ? ?Per HPI unless specifically indicated above ? ?   ?Objective:  ?  ?BP (!) 155/89   Pulse 87   Temp 99.1 ?F (37.3 ?C) (Oral)   Ht 5' 5.51" (1.664 m)   Wt 173 lb 9.6 oz (78.7 kg)   SpO2 97%   BMI 28.44 kg/m?   ?Wt Readings from Last 3 Encounters:  ?11/22/21 173 lb 9.6 oz (78.7 kg)  ?06/08/21 175 lb (79.4 kg)  ?04/06/20 171 lb (77.6 kg)  ?  ?Physical Exam ?Vitals and nursing note reviewed.  ?Constitutional:   ?   General: She is not in acute distress. ?   Appearance: Normal appearance. She is not ill-appearing or diaphoretic.  ?Eyes:  ?   Conjunctiva/sclera: Conjunctivae normal.   ?Cardiovascular:  ?   Rate and Rhythm: Normal rate and regular rhythm.  ?   Heart sounds: No murmur heard. ?  No friction rub. No gallop.  ?Pulmonary:  ?   Effort: No respiratory distress.  ?   Breath sounds: No stridor. Wheezing present. No rhonchi or rales.  ?Chest:  ?   Chest wall: No tenderness.  ?Abdominal:  ?   Tenderness: There is no guarding.  ?Neurological:  ?   Mental Status: She is alert.  ? ? ?Results for orders placed or performed in visit on 06/08/21  ?Lipid panel  ?Result Value Ref Range  ? Cholesterol, Total 188 100 - 199 mg/dL  ? Triglycerides 134 0 - 149 mg/dL  ? HDL 53 >39 mg/dL  ? VLDL Cholesterol Cal 24 5 - 40 mg/dL  ? LDL Chol Calc (NIH) 111 (H) 0 - 99 mg/dL  ? Chol/HDL Ratio 3.5 0.0 - 4.4 ratio  ?Comprehensive metabolic panel  ?Result Value Ref Range  ? Glucose 97 70 - 99 mg/dL  ? BUN 9 6 - 24 mg/dL  ? Creatinine, Ser 0.70 0.57 - 1.00 mg/dL  ? eGFR 104 >59 mL/min/1.73  ? BUN/Creatinine Ratio 13 9 - 23  ? Sodium 139 134 - 144 mmol/L  ?  Potassium 4.6 3.5 - 5.2 mmol/L  ? Chloride 101 96 - 106 mmol/L  ? CO2 22 20 - 29 mmol/L  ? Calcium 9.8 8.7 - 10.2 mg/dL  ? Total Protein 7.0 6.0 - 8.5 g/dL  ? Albumin 4.7 3.8 - 4.9 g/dL  ? Globulin, Total 2.3 1.5 - 4.5 g/dL  ? Albumin/Globulin Ratio 2.0 1.2 - 2.2  ? Bilirubin Total 0.4 0.0 - 1.2 mg/dL  ? Alkaline Phosphatase 82 44 - 121 IU/L  ? AST 15 0 - 40 IU/L  ? ALT 19 0 - 32 IU/L  ? ?   ? ? ?Current Outpatient Medications:  ??  albuterol (VENTOLIN HFA) 108 (90 Base) MCG/ACT inhaler, Inhale 2 puffs into the lungs every 6 (six) hours as needed for wheezing or shortness of breath., Disp: 18 g, Rfl: 2 ??  clonazePAM (KLONOPIN) 0.5 MG tablet, TAKE 1 TABLET(0.5 MG) BY MOUTH DAILY AS NEEDED FOR ANXIETY, Disp: 30 tablet, Rfl: 0 ??  fexofenadine (ALLEGRA ALLERGY) 180 MG tablet, Take 1 tablet (180 mg total) by mouth daily., Disp: 10 tablet, Rfl: 1 ??  levonorgestrel (MIRENA) 20 MCG/24HR IUD, by Intrauterine route., Disp: , Rfl:  ??  valACYclovir (VALTREX) 1000 MG  tablet, TAKE 2 TABLET(1000 MG) BY MOUTH BID for 1 day prn sx, Disp: 30 tablet, Rfl: 0  ? ? ?Assessment & Plan:  ?Acute exacerbation of ? Asthma sec to ac URI -ve for strept and flu . Covid pending.  ?Given patient has a 22-year history of smoking about a pack to pack and a half a day.  ?DuoNebs given x1.  Solu-Medrol 120 mg administered IM today. ?Will start patient on Spiriva inhaled once a day for presumed COPD as well as albuterol as rescue inhaler. ?Patient does states she has used this in the past as her mom had COPD and lung cancer. ?Will refer to pulmonology for PFTs. ? ?Problem List Items Addressed This Visit   ? ?  ? Respiratory  ? Upper respiratory tract infection - Primary  ? Relevant Orders  ? Veritor Flu A/B Waived  ? Rapid Strep Screen ([facility] ONLY)  ? Novel Coronavirus, NAA ([facility])  ?  ? ?Orders Placed This Encounter  ?Procedures  ?? Rapid Strep Screen ([facility] ONLY)  ?? Novel Coronavirus, NAA ([facility])  ?? Veritor Flu A/B Waived  ?  ? ?No orders of the defined types were placed in this encounter. ?  ? ?Follow up plan: ?No follow-ups on file. ? ? ?

## 2021-11-22 NOTE — Telephone Encounter (Signed)
Copied from CRM [id number]. Topic: General - Other ?>> Nov 22, 2021  4:13 PM Pawlus, Maxine Glenn A wrote: ?Reason for CRM: Pt called in for more information regarding her chest xray order, pt stated she needs the diagnosis code to find a place that accepts her insurance, please advise. ?

## 2021-11-23 LAB — NOVEL CORONAVIRUS, NAA: SARS-CoV-2, NAA: NOT DETECTED

## 2021-11-23 NOTE — Telephone Encounter (Signed)
Called and provided information to the patient.  ?

## 2021-11-23 NOTE — Progress Notes (Signed)
Please let pt know this was normal.

## 2021-11-24 ENCOUNTER — Other Ambulatory Visit: Payer: Self-pay

## 2021-11-24 ENCOUNTER — Ambulatory Visit
Admission: RE | Admit: 2021-11-24 | Discharge: 2021-11-24 | Disposition: A | Discharge status: Home / Self Care | Payer: BC Managed Care – PPO | Source: Ambulatory Visit | Attending: Internal Medicine | Admitting: Internal Medicine

## 2021-11-24 ENCOUNTER — Ambulatory Visit: Payer: Self-pay | Admitting: *Deleted

## 2021-11-24 ENCOUNTER — Ambulatory Visit: Payer: Self-pay

## 2021-11-24 ENCOUNTER — Other Ambulatory Visit: Payer: Self-pay | Admitting: Family Medicine

## 2021-11-24 ENCOUNTER — Ambulatory Visit
Admission: RE | Admit: 2021-11-24 | Discharge: 2021-11-24 | Disposition: A | Discharge status: Home / Self Care | Payer: BC Managed Care – PPO | Attending: Internal Medicine | Admitting: Internal Medicine

## 2021-11-24 DIAGNOSIS — J069 Acute upper respiratory infection, unspecified: Secondary | ICD-10-CM | POA: Diagnosis not present

## 2021-11-24 DIAGNOSIS — R062 Wheezing: Secondary | ICD-10-CM

## 2021-11-24 DIAGNOSIS — J189 Pneumonia, unspecified organism: Secondary | ICD-10-CM

## 2021-11-24 MED ORDER — AMOXICILLIN-POT CLAVULANATE 875-125 MG PO TABS: 1.0000 | ORAL_TABLET | Freq: Two times a day (BID) | ORAL | 0 refills | Status: DC

## 2021-11-24 MED ORDER — ALBUTEROL SULFATE (2.5 MG/3ML) 0.083% IN NEBU: 2.5000 mg | INHALATION_SOLUTION | Freq: Four times a day (QID) | RESPIRATORY_TRACT | 1 refills | Status: DC | PRN

## 2021-11-24 NOTE — Telephone Encounter (Signed)
Reason for Disposition ? [1] MILD longstanding difficulty breathing AND [2]  SAME as normal ? ?Answer Assessment - Initial Assessment Questions ?1. RESPIRATORY STATUS: "Describe your breathing?" (e.g., wheezing, shortness of breath, unable to speak, severe coughing)  ?    Wheezing ?2. ONSET: "When did this breathing problem begin?"  ?    Tuesday ?3. PATTERN "Does the difficult breathing come and go, or has it been constant since it started?"  ?    Not worse than Tuesday- same- no better ?4. SEVERITY: "How bad is your breathing?" (e.g., mild, moderate, severe)  ?  - MILD: No SOB at rest, mild SOB with walking, speaks normally in sentences, can lie down, no retractions, pulse < 100.  ?  - MODERATE: SOB at rest, SOB with minimal exertion and prefers to sit, cannot lie down flat, speaks in phrases, mild retractions, audible wheezing, pulse 100-120.  ?  - SEVERE: Very SOB at rest, speaks in single words, struggling to breathe, sitting hunched forward, retractions, pulse > 120  ?    Chest X ray today, started antibiotic, patient has inhaler not sure it is helping- nebulizer- wants to be able to do breathing treatment at home. Mild today ?5. RECURRENT SYMPTOM: "Have you had difficulty breathing before?" If Yes, ask: "When was the last time?" and "What happened that time?"  ?    Was seen in office Wednesday ?6. CARDIAC HISTORY: "Do you have any history of heart disease?" (e.g., heart attack, angina, bypass surgery, angioplasty)  ?    *No Answer* ?7. LUNG HISTORY: "Do you have any history of lung disease?"  (e.g., pulmonary embolus, asthma, emphysema) ?    *No Answer* ?8. CAUSE: "What do you think is causing the breathing problem?"  ?    *No Answer* ?9. OTHER SYMPTOMS: "Do you have any other symptoms? (e.g., dizziness, runny nose, cough, chest pain, fever) ?    Sunday -ear, Monday- sinus drainage, Tuesday- chest cough- something different everyday ?10. O2 SATURATION MONITOR:  "Do you use an oxygen saturation monitor (pulse  oximeter) at home?" If Yes, "What is your reading (oxygen level) today?" "What is your usual oxygen saturation reading?" (e.g., 95%) ?      Yes- but not at home ?11. PREGNANCY: "Is there any chance you are pregnant?" "When was your last menstrual period?" ?      *No Answer* ?12. TRAVEL: "Have you traveled out of the country in the last month?" (e.g., travel history, exposures) ?      *No Answer* ? ?Protocols used: Breathing Difficulty-A-AH ? ?

## 2021-11-24 NOTE — Telephone Encounter (Signed)
Pt. States she is going in for a chest x-ray and will have to call back. ?

## 2021-11-24 NOTE — Telephone Encounter (Signed)
Routing to provider who saw the patient to advise. Ok to write order for nebulizer? ?

## 2021-11-24 NOTE — Telephone Encounter (Signed)
Order printed and placed in providers folder for signature.  ?

## 2021-11-24 NOTE — Telephone Encounter (Signed)
OK to write order for neb and I'll sign ?

## 2021-11-24 NOTE — Telephone Encounter (Signed)
?  Chief Complaint: patient reports she is same not better- now has wheezing ?Symptoms: wheezing, inhaler expired 2022- reports not helping much at all- patient is requesting nebulizer ?Frequency: symptoms started Sunday with getting worse- Tuesday ?Pertinent Negatives: Patient denies fever ?Disposition: [] ED /[] Urgent Care (no appt availability in office) / [] Appointment(In office/virtual)/ []  [facility]/ [] Home Care/ [] Refused Recommended Disposition /[] [facility]/ [x]  Follow-up with PCP ?Additional Notes: Patient had X ray today- requesting nebulizer- may need new inhaler- exp date 2022  ?

## 2021-11-25 ENCOUNTER — Encounter: Payer: Self-pay | Admitting: Internal Medicine

## 2021-11-25 LAB — CULTURE, GROUP A STREP: Strep A Culture: NEGATIVE

## 2021-11-25 LAB — VERITOR FLU A/B WAIVED
Influenza A: NEGATIVE
Influenza B: NEGATIVE

## 2021-11-25 LAB — RAPID STREP SCREEN (MED CTR MEBANE ONLY): Strep Gp A Ag, IA W/Reflex: NEGATIVE

## 2021-12-06 ENCOUNTER — Telehealth: Payer: Self-pay | Admitting: Nurse Practitioner

## 2021-12-06 NOTE — Telephone Encounter (Signed)
Copied from CRM [id number]. Topic: General - Other ?>> Dec 05, 2021  4:57 PM Gaetana Michaelis A wrote: ?Reason for CRM: The patient would like their imaging orders faxed to [facility] at [phone]  ? ?Please contact the patient further if needed ?

## 2021-12-06 NOTE — Telephone Encounter (Signed)
LVM advising pt I have faxed over the orders.  ?

## 2021-12-07 ENCOUNTER — Encounter: Payer: Self-pay | Admitting: Internal Medicine

## 2021-12-07 NOTE — Telephone Encounter (Signed)
Please send the order to where she needs it thnx ?Please have her fu with her pcp thnx.

## 2021-12-08 ENCOUNTER — Ambulatory Visit
Admission: RE | Admit: 2021-12-08 | Discharge: 2021-12-08 | Disposition: A | Discharge status: Home / Self Care | Payer: BC Managed Care – PPO | Source: Ambulatory Visit | Attending: Family Medicine | Admitting: Family Medicine

## 2021-12-08 DIAGNOSIS — Z8701 Personal history of pneumonia (recurrent): Secondary | ICD-10-CM | POA: Diagnosis not present

## 2021-12-08 DIAGNOSIS — J189 Pneumonia, unspecified organism: Secondary | ICD-10-CM

## 2021-12-08 NOTE — Telephone Encounter (Signed)
Order sent.

## 2021-12-29 ENCOUNTER — Telehealth: Payer: Self-pay | Admitting: Nurse Practitioner

## 2021-12-29 NOTE — Telephone Encounter (Signed)
Copied from CRM [id number]. Topic: General - Other >> Dec 29, 2021 11:48 AM McGill, Darlina Rumpf wrote: Reason for CRM: Pt would like to discuss with someone in the office "what all she was charged for on her most recent visit from 11/22/2021." Pt is requesting a call back to discuss.  Please advise.

## 2022-01-03 ENCOUNTER — Ambulatory Visit
Admission: RE | Admit: 2022-01-03 | Discharge: 2022-01-03 | Disposition: A | Discharge status: Home / Self Care | Payer: BC Managed Care – PPO | Source: Ambulatory Visit | Attending: Obstetrics and Gynecology | Admitting: Obstetrics and Gynecology

## 2022-01-03 ENCOUNTER — Ambulatory Visit: Payer: BC Managed Care – PPO

## 2022-01-03 DIAGNOSIS — Z1231 Encounter for screening mammogram for malignant neoplasm of breast: Secondary | ICD-10-CM | POA: Diagnosis not present

## 2022-01-08 NOTE — Telephone Encounter (Signed)
Spoke with patient regarding charges. After explanation patient understood and agreed to pay accordingly.

## 2022-01-09 DIAGNOSIS — M25532 Pain in left wrist: Secondary | ICD-10-CM | POA: Diagnosis not present

## 2022-01-09 DIAGNOSIS — M79645 Pain in left finger(s): Secondary | ICD-10-CM | POA: Diagnosis not present

## 2022-01-16 DIAGNOSIS — M25532 Pain in left wrist: Secondary | ICD-10-CM | POA: Diagnosis not present

## 2022-01-16 DIAGNOSIS — S63502D Unspecified sprain of left wrist, subsequent encounter: Secondary | ICD-10-CM | POA: Diagnosis not present

## 2022-01-31 DIAGNOSIS — M25532 Pain in left wrist: Secondary | ICD-10-CM | POA: Diagnosis not present

## 2022-03-05 DIAGNOSIS — H109 Unspecified conjunctivitis: Secondary | ICD-10-CM | POA: Diagnosis not present

## 2022-03-05 DIAGNOSIS — R0981 Nasal congestion: Secondary | ICD-10-CM | POA: Diagnosis not present

## 2022-03-05 DIAGNOSIS — J40 Bronchitis, not specified as acute or chronic: Secondary | ICD-10-CM | POA: Diagnosis not present

## 2022-03-05 NOTE — Progress Notes (Deleted)
   There were no vitals taken for this visit.   Subjective:    Patient ID: Jenna James, female    DOB: August 09, 1970, 51 y.o.   MRN: [id number]  HPI: Jenna James is a 51 y.o. female  No chief complaint on file.  UPPER RESPIRATORY TRACT INFECTION Worst symptom: Fever: {Blank single:19197::"yes","no"} Cough: {Blank single:19197::"yes","no"} Shortness of breath: {Blank single:19197::"yes","no"} Wheezing: {Blank single:19197::"yes","no"} Chest pain: {Blank single:19197::"yes","no","yes, with cough"} Chest tightness: {Blank single:19197::"yes","no"} Chest congestion: {Blank single:19197::"yes","no"} Nasal congestion: {Blank single:19197::"yes","no"} Runny nose: {Blank single:19197::"yes","no"} Post nasal drip: {Blank single:19197::"yes","no"} Sneezing: {Blank single:19197::"yes","no"} Sore throat: {Blank single:19197::"yes","no"} Swollen glands: {Blank single:19197::"yes","no"} Sinus pressure: {Blank single:19197::"yes","no"} Headache: {Blank single:19197::"yes","no"} Face pain: {Blank single:19197::"yes","no"} Toothache: {Blank single:19197::"yes","no"} Ear pain: {Blank single:19197::"yes","no"} {Blank single:19197::""right","left", "bilateral"} Ear pressure: {Blank single:19197::"yes","no"} {Blank single:19197::""right","left", "bilateral"} Eyes red/itching:{Blank single:19197::"yes","no"} Eye drainage/crusting: {Blank single:19197::"yes","no"}  Vomiting: {Blank single:19197::"yes","no"} Rash: {Blank single:19197::"yes","no"} Fatigue: {Blank single:19197::"yes","no"} Sick contacts: {Blank single:19197::"yes","no"} Strep contacts: {Blank single:19197::"yes","no"}  Context: {Blank multiple:19196::"better","worse","stable","fluctuating"} Recurrent sinusitis: {Blank single:19197::"yes","no"} Relief with OTC cold/cough medications: {Blank single:19197::"yes","no"}  Treatments attempted: {Blank multiple:19196::"none","cold/sinus","mucinex","anti-histamine","pseudoephedrine","cough  syrup","antibiotics"}   Relevant past medical, surgical, family and social history reviewed and updated as indicated. Interim medical history since our last visit reviewed. Allergies and medications reviewed and updated.  Review of Systems  Per HPI unless specifically indicated above     Objective:    There were no vitals taken for this visit.  Wt Readings from Last 3 Encounters:  11/22/21 173 lb 9.6 oz (78.7 kg)  06/08/21 175 lb (79.4 kg)  04/06/20 171 lb (77.6 kg)    Physical Exam  Results for orders placed or performed in visit on 11/22/21  Rapid Strep Screen ([facility] ONLY)   Specimen: Other   Other  Result Value Ref Range   Strep Gp A Ag, IA W/Reflex Negative Negative  Novel Coronavirus, NAA ([facility])   Specimen: Nasopharyngeal(NP) swabs in vial transport medium  Result Value Ref Range   SARS-CoV-2, NAA Not Detected Not Detected  Culture, Group A Strep   Other  Result Value Ref Range   Strep A Culture Negative   Veritor Flu A/B Waived  Result Value Ref Range   Influenza A Negative Negative   Influenza B Negative Negative      Assessment & Plan:   Problem List Items Addressed This Visit   None    Follow up plan: No follow-ups on file.

## 2022-03-06 ENCOUNTER — Ambulatory Visit: Payer: BC Managed Care – PPO | Admitting: Nurse Practitioner

## 2022-04-06 ENCOUNTER — Other Ambulatory Visit: Payer: Self-pay | Admitting: Obstetrics and Gynecology

## 2022-04-23 ENCOUNTER — Encounter: Payer: Self-pay | Admitting: Nurse Practitioner

## 2022-04-23 ENCOUNTER — Encounter: Payer: Self-pay | Admitting: Obstetrics and Gynecology

## 2022-04-23 DIAGNOSIS — M349 Systemic sclerosis, unspecified: Secondary | ICD-10-CM

## 2022-06-13 DIAGNOSIS — M9903 Segmental and somatic dysfunction of lumbar region: Secondary | ICD-10-CM | POA: Diagnosis not present

## 2022-06-13 DIAGNOSIS — M9902 Segmental and somatic dysfunction of thoracic region: Secondary | ICD-10-CM | POA: Diagnosis not present

## 2022-06-13 DIAGNOSIS — M9905 Segmental and somatic dysfunction of pelvic region: Secondary | ICD-10-CM | POA: Diagnosis not present

## 2022-06-13 DIAGNOSIS — M9904 Segmental and somatic dysfunction of sacral region: Secondary | ICD-10-CM | POA: Diagnosis not present

## 2022-06-18 DIAGNOSIS — M9904 Segmental and somatic dysfunction of sacral region: Secondary | ICD-10-CM | POA: Diagnosis not present

## 2022-06-18 DIAGNOSIS — M9902 Segmental and somatic dysfunction of thoracic region: Secondary | ICD-10-CM | POA: Diagnosis not present

## 2022-06-18 DIAGNOSIS — M9903 Segmental and somatic dysfunction of lumbar region: Secondary | ICD-10-CM | POA: Diagnosis not present

## 2022-06-18 DIAGNOSIS — M9905 Segmental and somatic dysfunction of pelvic region: Secondary | ICD-10-CM | POA: Diagnosis not present

## 2022-06-25 DIAGNOSIS — M9904 Segmental and somatic dysfunction of sacral region: Secondary | ICD-10-CM | POA: Diagnosis not present

## 2022-06-25 DIAGNOSIS — M9905 Segmental and somatic dysfunction of pelvic region: Secondary | ICD-10-CM | POA: Diagnosis not present

## 2022-06-25 DIAGNOSIS — M9903 Segmental and somatic dysfunction of lumbar region: Secondary | ICD-10-CM | POA: Diagnosis not present

## 2022-06-25 DIAGNOSIS — M9902 Segmental and somatic dysfunction of thoracic region: Secondary | ICD-10-CM | POA: Diagnosis not present

## 2022-07-04 DIAGNOSIS — R238 Other skin changes: Secondary | ICD-10-CM | POA: Diagnosis not present

## 2022-07-04 DIAGNOSIS — D2372 Other benign neoplasm of skin of left lower limb, including hip: Secondary | ICD-10-CM | POA: Diagnosis not present

## 2022-07-04 DIAGNOSIS — L821 Other seborrheic keratosis: Secondary | ICD-10-CM | POA: Diagnosis not present

## 2022-07-04 DIAGNOSIS — L538 Other specified erythematous conditions: Secondary | ICD-10-CM | POA: Diagnosis not present

## 2022-07-04 DIAGNOSIS — B078 Other viral warts: Secondary | ICD-10-CM | POA: Diagnosis not present

## 2022-07-04 DIAGNOSIS — L940 Localized scleroderma [morphea]: Secondary | ICD-10-CM | POA: Diagnosis not present

## 2022-07-18 ENCOUNTER — Other Ambulatory Visit: Payer: Self-pay

## 2022-07-18 ENCOUNTER — Emergency Department: Payer: BC Managed Care – PPO

## 2022-07-18 ENCOUNTER — Encounter: Payer: Self-pay | Admitting: Emergency Medicine

## 2022-07-18 ENCOUNTER — Emergency Department
Admission: EM | Admit: 2022-07-18 | Discharge: 2022-07-19 | Disposition: A | Discharge status: Home / Self Care | Payer: BC Managed Care – PPO | Attending: Emergency Medicine | Admitting: Emergency Medicine

## 2022-07-18 DIAGNOSIS — M25562 Pain in left knee: Secondary | ICD-10-CM | POA: Insufficient documentation

## 2022-07-18 DIAGNOSIS — M79605 Pain in left leg: Secondary | ICD-10-CM | POA: Insufficient documentation

## 2022-07-18 MED ORDER — MELOXICAM 15 MG PO TABS: 15.0000 mg | ORAL_TABLET | Freq: Every day | ORAL | 0 refills | Status: DC

## 2022-07-18 NOTE — ED Provider Notes (Signed)
[facility] Provider Note  Patient Contact: 11:04 PM (approximate)   History   Knee Pain   HPI  Jenna James is a 51 y.o. female complaining of left posterior left knee pain.  Patient was sent from urgent care for evaluation of DVT.  No edema, erythema on the leg.  Patient states that she has twinges of pain in the back of the knee.  I saw the patient in triage and placed orders for x-ray and ultrasound.     Physical Exam   Triage Vital Signs: ED Triage Vitals  Enc Vitals Group     BP 07/18/22 1935 (!) 174/94     Pulse Rate 07/18/22 1935 78     Resp 07/18/22 1935 17     Temp 07/18/22 1935 98.1 F (36.7 C)     Temp Source 07/18/22 1935 Oral     SpO2 07/18/22 1935 99 %     Weight 07/18/22 1936 159 lb (72.1 kg)     Height 07/18/22 1936 5\' 5"  (1.651 m)     Head Circumference --      Peak Flow --      Pain Score --      Pain Loc --      Pain Edu? --      Excl. in GC? --     Most recent vital signs: Vitals:   07/18/22 1935  BP: (!) 174/94  Pulse: 78  Resp: 17  Temp: 98.1 F (36.7 C)  SpO2: 99%     General: Alert and in no acute distress.  Cardiovascular:  Good peripheral perfusion Respiratory: Normal respiratory effort without tachypnea or retractions. Lungs CTAB.  Musculoskeletal: Full range of motion to all extremities.  No gross edema, erythema to the left leg.  Slight tenderness in the popliteal fossa.  No palpable abnormality.  Pulses sensation intact distally.  Patient has full range of motion to the knee.  Ambulates without a limp. Neurologic:  No gross focal neurologic deficits are appreciated.  Skin:   No rash noted Other:   ED Results / Procedures / Treatments   Labs (all labs ordered are listed, but only abnormal results are displayed) Labs Reviewed - No data to display   EKG     RADIOLOGY  I personally viewed, evaluated, and interpreted these images as part of my medical decision making, as well as reviewing the  written report by the radiologist.  ED Provider Interpretation: No acute osseous finding.  ACL hardware screws intact without evidence of loosening.  No evidence of DVT on ultrasound.  07/20/22 Venous Img Lower Unilateral Left  Result Date: 07/18/2022 CLINICAL DATA:  Pain EXAM: LEFT LOWER EXTREMITY VENOUS DOPPLER ULTRASOUND TECHNIQUE: Gray-scale sonography with compression, as well as color and duplex ultrasound, were performed to evaluate the deep venous system(s) from the level of the common femoral vein through the popliteal and proximal calf veins. COMPARISON:  None Available. FINDINGS: VENOUS Normal compressibility of the common femoral, superficial femoral, and popliteal veins, as well as the visualized calf veins. Visualized portions of profunda femoral vein and great saphenous vein unremarkable. No filling defects to suggest DVT on grayscale or color Doppler imaging. Doppler waveforms show normal direction of venous flow, normal respiratory plasticity and response to augmentation. Limited views of the contralateral common femoral vein are unremarkable. OTHER None. Limitations: none IMPRESSION: Negative. Electronically Signed   By: 07/20/2022 M.D.   On: 07/18/2022 21:49   DG Knee Complete 4 Views Left  Result  Date: 07/18/2022 CLINICAL DATA:  Knee pain EXAM: LEFT KNEE - COMPLETE 4+ VIEW COMPARISON:  None Available. FINDINGS: No fracture or malalignment. Previous Biochemist, clinical. Joint spaces are patent. No significant effusion. IMPRESSION: No acute osseous abnormality. Previous Biochemist, clinical. Electronically Signed   By: Jasmine Pang M.D.   On: 07/18/2022 20:20    PROCEDURES:  Critical Care performed: No  Procedures   MEDICATIONS ORDERED IN ED: Medications - No data to display   IMPRESSION / MDM / ASSESSMENT AND PLAN / ED COURSE  I reviewed the triage vital signs and the nursing notes.                              Differential diagnosis includes, but is not limited to, DVT, hamstring strain,  loosening of hardware, occult fracture  Patient's presentation is most consistent with acute presentation with potential threat to life or bodily function.   Patient's diagnosis is consistent with acute knee pain.  Patient presents emergency department with posterior left knee pain.  Patient was referred from urgent care for ultrasound to ensure no evidence of DVT.  No clinical findings concerning for DVT and ultrasound was negative.  X-ray is reassuring at this time without acute findings.  Anti-inflammatory prescribed for the patient.  Wear/utilize a neoprene knee sleeve at home.  Follow-up with primary care as needed.  Return precautions discussed with the patient.. . Patient is given ED precautions to return to the ED for any worsening or new symptoms.        FINAL CLINICAL IMPRESSION(S) / ED DIAGNOSES   Final diagnoses:  Acute pain of left knee     Rx / DC Orders   ED Discharge Orders          Ordered    meloxicam (MOBIC) 15 MG tablet  Daily        07/18/22 2312             Note:  This document was prepared using Dragon voice recognition software and may include unintentional dictation errors.   Lanette Hampshire 07/18/22 2320    Shaune Pollack, MD 07/22/22 [phone]

## 2022-07-18 NOTE — ED Triage Notes (Signed)
Pt c/o posterior left knee throbbing x1 day. Pt advised by nurse line to come for DVT rule out due to her sitting continuously for her job. Area not swollen or hot to the touch. Pt sts pain intermittent and only last seconds when occurring. Pt denies injury but sts hx/o ACL repair in left knee.

## 2022-07-18 NOTE — ED Provider Triage Note (Signed)
Emergency Medicine Provider Triage Evaluation Note  Jenna James , a 51 y.o. female  was evaluated in triage.  Pt complains of posterior L knee pain. Non-traumatic. No HX of DVT.No gross edema of Leg.   Review of Systems  Positive: L knee pain Negative: Fever, cp, shob  Physical Exam  BP (!) 174/94 (BP Location: Left Arm)   Pulse 78   Temp 98.1 F (36.7 C) (Oral)   Resp 17   Ht 5\' 5"  (1.651 m)   Wt 72.1 kg   SpO2 99%   BMI 26.46 kg/m  Gen:   Awake, no distress   Resp:  Normal effort  MSK:   Moves extremities without difficulty  Other:    Medical Decision Making  Medically screening exam initiated at 7:40 PM.  Appropriate orders placed.  Jenna James was informed that the remainder of the evaluation will be completed by another provider, this initial triage assessment does not replace that evaluation, and the importance of remaining in the ED until their evaluation is complete.  Xray, Marylene Land   Mumin Denomme D, [place] 07/18/22 1941

## 2022-07-25 ENCOUNTER — Telehealth: Payer: Self-pay | Admitting: *Deleted

## 2022-07-25 NOTE — Patient Outreach (Signed)
  Care Coordination [facility] Note Transition Care Management Unsuccessful Follow-up Telephone Call  Date of discharge and from where:  [facility] [phone]  Attempts:  1st Attempt  Reason for unsuccessful TCM follow-up call:  Left voice message  Gean Maidens BSN RN [facility] Care Management [phone]

## 2022-07-26 ENCOUNTER — Telehealth: Payer: Self-pay | Admitting: *Deleted

## 2022-07-26 NOTE — Patient Outreach (Signed)
  Care Coordination TOC Note Transition Care Management Follow-up Telephone Call Date of discharge and from where: [facility] [id number] ER How have you been since you were released from the hospital? I am doing much better and I haven't even had to take the Meloxicam Any questions or concerns? No  Items Reviewed: Did the pt receive and understand the discharge instructions provided? Yes  Medications obtained and verified? Yes  Other? No  Any new allergies since your discharge? No  Dietary orders reviewed? No Do you have support at home? Yes   Home Care and Equipment/Supplies: Were home health services ordered? no If so, what is the name of the agency? N/a  Has the agency set up a time to come to the patient's home? no Were any new equipment or medical supplies ordered?  No What is the name of the medical supply agency? N/a Were you able to get the supplies/equipment? no Do you have any questions related to the use of the equipment or supplies? No  Functional Questionnaire: (I = Independent and D = Dependent) ADLs: I Bathing/Dressing- I    Meal Prep- I  Eating- I  Maintaining continence- I  Transferring/Ambulation- I  Managing Meds- I  Follow up appointments reviewed:  PCP Hospital f/u appt confirmed? No  Follow up with Larae Grooms, NP (Nurse Practitioner);  Specialist Hospital f/u appt confirmed? No   Are transportation arrangements needed? No  If their condition worsens, is the pt aware to call PCP or go to the Emergency Dept.? Yes Was the patient provided with contact information for the PCP's office or ED? Yes Was to pt encouraged to call back with questions or concerns? Yes  SDOH assessments and interventions completed:   Yes SDOH Interventions Today    Flowsheet Row Most Recent Value  SDOH Interventions   Food Insecurity Interventions Intervention Not Indicated  Housing Interventions Intervention Not Indicated  Transportation Interventions Intervention Not  Indicated       Care Coordination Interventions:  No Care Coordination interventions needed at this time.   Encounter Outcome:  Pt. Visit Completed    Gean Maidens BSN RN [facility] [phone].

## 2022-09-11 ENCOUNTER — Other Ambulatory Visit: Payer: Self-pay | Admitting: Obstetrics and Gynecology

## 2022-09-12 ENCOUNTER — Other Ambulatory Visit: Payer: Self-pay | Admitting: Nurse Practitioner

## 2022-09-12 ENCOUNTER — Other Ambulatory Visit: Payer: Self-pay | Admitting: Obstetrics and Gynecology

## 2022-09-12 DIAGNOSIS — Z1231 Encounter for screening mammogram for malignant neoplasm of breast: Secondary | ICD-10-CM

## 2022-09-12 NOTE — Telephone Encounter (Signed)
Medication Refill - Medication: clonazePAM (KLONOPIN) 0.5 MG tablet  Pt days obgyn this was prescribed with can't see her until March to get this, so she is requesting with [place]  Has the patient contacted their pharmacy? no (Agent: If no, request that the patient contact the pharmacy for the refill. If patient does not wish to contact the pharmacy document the reason why and proceed with request.) (Agent: If yes, when and what did the pharmacy advise?)patient called direclty in  Preferred Pharmacy (with phone number or street name):  [facility] [address] Phone: [phone]  Fax: [phone]     Has the patient been seen for an appointment in the last year OR does the patient have an upcoming appointment? yes  Agent: Please be advised that RX refills may take up to 3 business days. We ask that you follow-up with your pharmacy.

## 2022-09-12 NOTE — Telephone Encounter (Signed)
Requested medication (s) are due for refill today: Yes  Requested medication (s) are on the active medication list: Yes  Last refill:  04/06/22  Future visit scheduled:No  Notes to clinic: Unable to refill per protocol, cannot delegate, last refill by another provider. Would like PCP to refill due to unable to see OBGYN until March.      Requested Prescriptions  Pending Prescriptions Disp Refills   clonazePAM (KLONOPIN) 0.5 MG tablet 30 tablet 0     Not Delegated - Psychiatry: Anxiolytics/Hypnotics 2 Failed - 09/12/2022 11:25 AM      Failed - This refill cannot be delegated      Failed - Urine Drug Screen completed in last 360 days      Failed - Valid encounter within last 6 months    Recent Outpatient Visits           9 months ago Upper respiratory tract infection, unspecified type   [place] [facility], [place], [place]   1 year ago Upper respiratory tract infection, unspecified type   [place] Charlynne Cousins, [place]   2 years ago [place], [place]   3 years ago Seasonal allergic rhinitis due to other allergic trigger   [place] [facility] Volney American, [place]   3 years ago [place], [place], [place]              Passed - Patient is not pregnant

## 2022-09-12 NOTE — Telephone Encounter (Signed)
Patient called and advised med was refused due to she needs an appointment. She says she can't afford to do that. I advised due to the med not being prescribed by PCP, she will need to evaluate her for this. She verbalized understanding and says she will check with her OB when she goes for the appointment in March.

## 2022-09-14 ENCOUNTER — Other Ambulatory Visit: Payer: Self-pay | Admitting: Obstetrics and Gynecology

## 2022-09-14 ENCOUNTER — Encounter: Payer: Self-pay | Admitting: Obstetrics and Gynecology

## 2022-09-14 MED ORDER — CLONAZEPAM 0.5 MG PO TABS: ORAL_TABLET | ORAL | 0 refills | Status: DC

## 2022-09-14 NOTE — Progress Notes (Signed)
Rx RF klonopin until 3/24 annual

## 2022-10-17 NOTE — Progress Notes (Deleted)
PCP: Jon Billings, NP   No chief complaint on file.   HPI:      [name] is a 52 y.o. [id number] whose LMP was No LMP recorded. (Menstrual status: IUD)., presents today for her annual examination.  Her menses are almost monthly now with IUD, lasting a few days, light flow, no BTB, no dysmen. Used to be amenorrheic.  Sex activity: single partner, contraception - IUD. Mirena REplaced 03/18/19 She uses lubricants prn, no pain/bleeding.  Last Pap: 03/18/19  Results were: no abnormalities /neg HPV DNA. No hx of abn paps.  Last mammogram: 01/03/22  Results were: normal--routine follow-up in 12 months; has appt 7/24 There is a FH of breast cancer in her PGM, genetic testing not indicated. There is no FH of ovarian cancer. There is a FH of non-metastatic prostate cancer in her dad. The patient does not do self-breast exams.  Colonoscopy: 2021 with Dr. Jacquiline Doe at [facility]; Repeat due after 7-10 years. There is a FH of colon cancer in her mat aunt.  Tobacco use: The patient denies current or previous tobacco use. Alcohol use: social drinker  No drug use Exercise: very active  She does get adequate calcium but not Vitamin D in her diet.  Labs with PCP in 4/21 with elevated TG. Due for repeat labs.   Past Medical History:  Diagnosis Date   Anxiety     Past Surgical History:  Procedure Laterality Date   ANTERIOR CRUCIATE LIGAMENT REPAIR Left    left knee   BREAST SURGERY     cyst removed   COLONOSCOPY  11/2019    Family History  Problem Relation Age of Onset   Prostate cancer Father    Colon cancer Maternal Aunt    Breast cancer Paternal Grandmother        26s?    Social History   Socioeconomic History   Marital status: Married    Spouse name: Not on file   Number of children: Not on file   Years of education: Not on file   Highest education level: Not on file  Occupational History   Not on file  Tobacco Use   Smoking status: Never   Smokeless tobacco: Never   Vaping Use   Vaping Use: Never used  Substance and Sexual Activity   Alcohol use: Yes    Alcohol/week: 1.0 - 2.0 standard drink of alcohol    Types: 1 - 2 Standard drinks or equivalent per week   Drug use: No   Sexual activity: Yes    Birth control/protection: I.U.D.    Comment: Mirena  Other Topics Concern   Not on file  Social History Narrative   Not on file   Social Determinants of Health   Financial Resource Strain: Not on file  Food Insecurity: No Food Insecurity (07/26/2022)   Hunger Vital Sign    Worried About Running Out of Food in the Last Year: Never true    [place] in the Last Year: Never true  Transportation Needs: Unknown (07/26/2022)   PRAPARE - Hydrologist (Medical): No    Lack of Transportation (Non-Medical): Not on file  Physical Activity: Not on file  Stress: Not on file  Social Connections: Not on file  Intimate Partner Violence: Not on file     Current Outpatient Medications:    albuterol (PROVENTIL) (2.5 MG/3ML) 0.083% nebulizer solution, Take 3 mLs (2.5 mg total) by nebulization every 6 (six) hours as  needed for wheezing or shortness of breath., Disp: 150 mL, Rfl: 1   albuterol (VENTOLIN HFA) 108 (90 Base) MCG/ACT inhaler, Inhale 2 puffs into the lungs every 6 (six) hours as needed for wheezing or shortness of breath., Disp: 18 g, Rfl: 2   amoxicillin-clavulanate (AUGMENTIN) 875-125 MG tablet, Take 1 tablet by mouth 2 (two) times daily., Disp: 20 tablet, Rfl: 0   clonazePAM (KLONOPIN) 0.5 MG tablet, TAKE 1 TABLET(0.5 MG) BY MOUTH DAILY AS NEEDED FOR ANXIETY, Disp: 30 tablet, Rfl: 0   fexofenadine (ALLEGRA ALLERGY) 180 MG tablet, Take 1 tablet (180 mg total) by mouth daily., Disp: 10 tablet, Rfl: 1   levonorgestrel (MIRENA) 20 MCG/24HR IUD, by Intrauterine route., Disp: , Rfl:    meloxicam (MOBIC) 15 MG tablet, Take 1 tablet (15 mg total) by mouth daily., Disp: 30 tablet, Rfl: 0   valACYclovir (VALTREX) 1000 MG  tablet, TAKE 2 TABLET(1000 MG) BY MOUTH BID for 1 day prn sx, Disp: 30 tablet, Rfl: 0     ROS:  Review of Systems  Constitutional:  Negative for fatigue, fever and unexpected weight change.  Respiratory:  Negative for cough, shortness of breath and wheezing.   Cardiovascular:  Negative for chest pain, palpitations and leg swelling.  Gastrointestinal:  Negative for blood in stool, constipation, diarrhea, nausea and vomiting.  Endocrine: Negative for cold intolerance, heat intolerance and polyuria.  Genitourinary:  Negative for dyspareunia, dysuria, flank pain, frequency, genital sores, hematuria, menstrual problem, pelvic pain, urgency, vaginal bleeding, vaginal discharge and vaginal pain.  Musculoskeletal:  Negative for back pain, joint swelling and myalgias.  Skin:  Negative for rash.  Neurological:  Negative for dizziness, syncope, light-headedness, numbness and headaches.  Hematological:  Negative for adenopathy.  Psychiatric/Behavioral:  Negative for agitation, confusion, sleep disturbance and suicidal ideas. The patient is not nervous/anxious.   BREAST: No symptoms    Objective: There were no vitals taken for this visit.   Physical Exam Constitutional:      Appearance: She is well-developed.  Genitourinary:     Vulva normal.     Genitourinary Comments: MILD RECTOCELE ON VALSALVA     Right Labia: No rash, tenderness or lesions.    Left Labia: No tenderness, lesions or rash.    No vaginal discharge, erythema or tenderness.      Right Adnexa: not tender and no mass present.    Left Adnexa: not tender and no mass present.    No cervical friability or polyp.     IUD strings visualized.     Uterus is not enlarged or tender.  Breasts:    Right: No mass, nipple discharge, skin change or tenderness.     Left: No mass, nipple discharge, skin change or tenderness.  Neck:     Thyroid: No thyromegaly.  Cardiovascular:     Rate and Rhythm: Normal rate and regular rhythm.      Heart sounds: Normal heart sounds. No murmur heard. Pulmonary:     Effort: Pulmonary effort is normal.     Breath sounds: Normal breath sounds.  Abdominal:     Palpations: Abdomen is soft.     Tenderness: There is no abdominal tenderness. There is no guarding or rebound.  Musculoskeletal:        General: Normal range of motion.     Cervical back: Normal range of motion.  Lymphadenopathy:     Cervical: No cervical adenopathy.  Neurological:     General: No focal deficit present.     Mental Status:  She is alert and oriented to person, place, and time.     Cranial Nerves: No cranial nerve deficit.  Skin:    General: Skin is warm and dry.  Psychiatric:        Mood and Affect: Mood normal.        Behavior: Behavior normal.        Thought Content: Thought content normal.        Judgment: Judgment normal.  Vitals reviewed.     Assessment/Plan: Encounter for annual routine gynecological examination  Encounter for routine checking of intrauterine contraceptive device (IUD)--IUD strings in cx os. Has 8 yr indication now.  Encounter for screening mammogram for malignant neoplasm of breast - Plan: MM 3D SCREEN BREAST BILATERAL; pt to sched mammo  Blood tests for routine general physical examination - Plan: Lipid panel, Comprehensive metabolic panel  Hypertriglyceridemia - Plan: Lipid panel; repeat labs today.   Screening cholesterol level - Plan: Lipid panel  Cold sore - Plan: valACYclovir (VALTREX) 1000 MG tablet; Rx RF.   No orders of the defined types were placed in this encounter.           GYN counsel adequate intake of calcium and vitamin D, diet and exercise    F/U  No follow-ups on file.  Bohden Dung B. Shanessa Hodak, PA-C 10/17/2022 5:51 PM

## 2022-10-18 ENCOUNTER — Ambulatory Visit: Payer: BC Managed Care – PPO | Admitting: Obstetrics and Gynecology

## 2022-10-18 DIAGNOSIS — B001 Herpesviral vesicular dermatitis: Secondary | ICD-10-CM

## 2022-10-18 DIAGNOSIS — Z30431 Encounter for routine checking of intrauterine contraceptive device: Secondary | ICD-10-CM

## 2022-10-18 DIAGNOSIS — Z1231 Encounter for screening mammogram for malignant neoplasm of breast: Secondary | ICD-10-CM

## 2022-10-18 DIAGNOSIS — E781 Pure hyperglyceridemia: Secondary | ICD-10-CM

## 2022-10-18 DIAGNOSIS — Z01419 Encounter for gynecological examination (general) (routine) without abnormal findings: Secondary | ICD-10-CM

## 2022-12-03 NOTE — Progress Notes (Unsigned)
PCP: Larae Grooms, NP   No chief complaint on file.   HPI:      Jenna James is a 52 y.o. [id number] whose LMP was No LMP recorded. (Menstrual status: IUD)., presents today for her annual examination.  Her menses are almost monthly now with IUD, lasting a few days, light flow, no BTB, no dysmen. Used to be amenorrheic.  Sex activity: single partner, contraception - IUD. Mirena REplaced 03/18/19 She uses lubricants prn, no pain/bleeding.  Last Pap: 03/18/19  Results were: no abnormalities /neg HPV DNA. No hx of abn paps.  Last mammogram: 01/03/22  Results were: normal--routine follow-up in 12 months; has appt 7/24 There is a FH of breast cancer in her PGM, genetic testing not indicated. There is no FH of ovarian cancer. There is a FH of non-metastatic prostate cancer in her dad. The patient does not do self-breast exams.  Colonoscopy: 2021 with Dr. Roberto Scales at [facility]; Repeat due after 7-10 years. There is a FH of colon cancer in her mat aunt.  Tobacco use: The patient denies current or previous tobacco use. Alcohol use: social drinker  No drug use Exercise: very active  She does get adequate calcium but not Vitamin D in her diet.  Due for fasting labs.   Past Medical History:  Diagnosis Date   Anxiety     Past Surgical History:  Procedure Laterality Date   ANTERIOR CRUCIATE LIGAMENT REPAIR Left    left knee   BREAST SURGERY     cyst removed   COLONOSCOPY  11/2019    Family History  Problem Relation Age of Onset   Prostate cancer Father    Colon cancer Maternal Aunt    Breast cancer Paternal Grandmother        33s?    Social History   Socioeconomic History   Marital status: Married    Spouse name: Not on file   Number of children: Not on file   Years of education: Not on file   Highest education level: Not on file  Occupational History   Not on file  Tobacco Use   Smoking status: Never   Smokeless tobacco: Never  Vaping Use   Vaping Use: Never used   Substance and Sexual Activity   Alcohol use: Yes    Alcohol/week: 1.0 - 2.0 standard drink of alcohol    Types: 1 - 2 Standard drinks or equivalent per week   Drug use: No   Sexual activity: Yes    Birth control/protection: I.U.D.    Comment: Mirena  Other Topics Concern   Not on file  Social History Narrative   Not on file   Social Determinants of Health   Financial Resource Strain: Not on file  Food Insecurity: No Food Insecurity (07/26/2022)   Hunger Vital Sign    Worried About Running Out of Food in the Last Year: Never true    Ran Out of Food in the Last Year: Never true  Transportation Needs: Unknown (07/26/2022)   PRAPARE - Administrator, Civil Service (Medical): No    Lack of Transportation (Non-Medical): Not on file  Physical Activity: Not on file  Stress: Not on file  Social Connections: Not on file  Intimate Partner Violence: Not on file     Current Outpatient Medications:    albuterol (PROVENTIL) (2.5 MG/3ML) 0.083% nebulizer solution, Take 3 mLs (2.5 mg total) by nebulization every 6 (six) hours as needed for wheezing or shortness of breath., Disp:  150 mL, Rfl: 1   albuterol (VENTOLIN HFA) 108 (90 Base) MCG/ACT inhaler, Inhale 2 puffs into the lungs every 6 (six) hours as needed for wheezing or shortness of breath., Disp: 18 g, Rfl: 2   amoxicillin-clavulanate (AUGMENTIN) 875-125 MG tablet, Take 1 tablet by mouth 2 (two) times daily., Disp: 20 tablet, Rfl: 0   clonazePAM (KLONOPIN) 0.5 MG tablet, TAKE 1 TABLET(0.5 MG) BY MOUTH DAILY AS NEEDED FOR ANXIETY, Disp: 30 tablet, Rfl: 0   fexofenadine (ALLEGRA ALLERGY) 180 MG tablet, Take 1 tablet (180 mg total) by mouth daily., Disp: 10 tablet, Rfl: 1   levonorgestrel (MIRENA) 20 MCG/24HR IUD, by Intrauterine route., Disp: , Rfl:    meloxicam (MOBIC) 15 MG tablet, Take 1 tablet (15 mg total) by mouth daily., Disp: 30 tablet, Rfl: 0   valACYclovir (VALTREX) 1000 MG tablet, TAKE 2 TABLET(1000 MG) BY MOUTH BID  for 1 day prn sx, Disp: 30 tablet, Rfl: 0     ROS:  Review of Systems  Constitutional:  Negative for fatigue, fever and unexpected weight change.  Respiratory:  Negative for cough, shortness of breath and wheezing.   Cardiovascular:  Negative for chest pain, palpitations and leg swelling.  Gastrointestinal:  Negative for blood in stool, constipation, diarrhea, nausea and vomiting.  Endocrine: Negative for cold intolerance, heat intolerance and polyuria.  Genitourinary:  Negative for dyspareunia, dysuria, flank pain, frequency, genital sores, hematuria, menstrual problem, pelvic pain, urgency, vaginal bleeding, vaginal discharge and vaginal pain.  Musculoskeletal:  Negative for back pain, joint swelling and myalgias.  Skin:  Negative for rash.  Neurological:  Negative for dizziness, syncope, light-headedness, numbness and headaches.  Hematological:  Negative for adenopathy.  Psychiatric/Behavioral:  Negative for agitation, confusion, sleep disturbance and suicidal ideas. The patient is not nervous/anxious.   BREAST: No symptoms    Objective: There were no vitals taken for this visit.   Physical Exam Constitutional:      Appearance: She is well-developed.  Genitourinary:     Vulva normal.     Genitourinary Comments: MILD RECTOCELE ON VALSALVA     Right Labia: No rash, tenderness or lesions.    Left Labia: No tenderness, lesions or rash.    No vaginal discharge, erythema or tenderness.      Right Adnexa: not tender and no mass present.    Left Adnexa: not tender and no mass present.    No cervical friability or polyp.     IUD strings visualized.     Uterus is not enlarged or tender.  Breasts:    Right: No mass, nipple discharge, skin change or tenderness.     Left: No mass, nipple discharge, skin change or tenderness.  Neck:     Thyroid: No thyromegaly.  Cardiovascular:     Rate and Rhythm: Normal rate and regular rhythm.     Heart sounds: Normal heart sounds. No murmur  heard. Pulmonary:     Effort: Pulmonary effort is normal.     Breath sounds: Normal breath sounds.  Abdominal:     Palpations: Abdomen is soft.     Tenderness: There is no abdominal tenderness. There is no guarding or rebound.  Musculoskeletal:        General: Normal range of motion.     Cervical back: Normal range of motion.  Lymphadenopathy:     Cervical: No cervical adenopathy.  Neurological:     General: No focal deficit present.     Mental Status: She is alert and oriented to person, place,  and time.     Cranial Nerves: No cranial nerve deficit.  Skin:    General: Skin is warm and dry.  Psychiatric:        Mood and Affect: Mood normal.        Behavior: Behavior normal.        Thought Content: Thought content normal.        Judgment: Judgment normal.  Vitals reviewed.     Assessment/Plan: Encounter for annual routine gynecological examination  Encounter for routine checking of intrauterine contraceptive device (IUD)--IUD strings in cx os. Has 8 yr indication now.  Encounter for screening mammogram for malignant neoplasm of breast - Plan: MM 3D SCREEN BREAST BILATERAL; pt to sched mammo  Blood tests for routine general physical examination - Plan: Lipid panel, Comprehensive metabolic panel  Hypertriglyceridemia - Plan: Lipid panel; repeat labs today.   Screening cholesterol level - Plan: Lipid panel  Cold sore - Plan: valACYclovir (VALTREX) 1000 MG tablet; Rx RF.   No orders of the defined types were placed in this encounter.           GYN counsel adequate intake of calcium and vitamin D, diet and exercise    F/U  No follow-ups on file.  Natlie Asfour B. Daphna Lafuente, PA-C 12/03/2022 5:19 PM

## 2022-12-04 ENCOUNTER — Ambulatory Visit (INDEPENDENT_AMBULATORY_CARE_PROVIDER_SITE_OTHER): Payer: BC Managed Care – PPO | Admitting: Obstetrics and Gynecology

## 2022-12-04 ENCOUNTER — Other Ambulatory Visit (HOSPITAL_COMMUNITY)
Admission: RE | Admit: 2022-12-04 | Discharge: 2022-12-04 | Disposition: A | Discharge status: Home / Self Care | Payer: BC Managed Care – PPO | Source: Ambulatory Visit | Attending: Obstetrics and Gynecology | Admitting: Obstetrics and Gynecology

## 2022-12-04 ENCOUNTER — Encounter: Payer: Self-pay | Admitting: Obstetrics and Gynecology

## 2022-12-04 VITALS — BP 108/80 | Ht 65.5 in | Wt 158.0 lb

## 2022-12-04 DIAGNOSIS — Z1151 Encounter for screening for human papillomavirus (HPV): Secondary | ICD-10-CM | POA: Insufficient documentation

## 2022-12-04 DIAGNOSIS — B001 Herpesviral vesicular dermatitis: Secondary | ICD-10-CM

## 2022-12-04 DIAGNOSIS — Z124 Encounter for screening for malignant neoplasm of cervix: Secondary | ICD-10-CM

## 2022-12-04 DIAGNOSIS — Z30431 Encounter for routine checking of intrauterine contraceptive device: Secondary | ICD-10-CM

## 2022-12-04 DIAGNOSIS — Z1231 Encounter for screening mammogram for malignant neoplasm of breast: Secondary | ICD-10-CM

## 2022-12-04 DIAGNOSIS — Z0000 Encounter for general adult medical examination without abnormal findings: Secondary | ICD-10-CM | POA: Diagnosis not present

## 2022-12-04 DIAGNOSIS — Z01419 Encounter for gynecological examination (general) (routine) without abnormal findings: Secondary | ICD-10-CM | POA: Diagnosis not present

## 2022-12-04 DIAGNOSIS — Z13220 Encounter for screening for lipoid disorders: Secondary | ICD-10-CM

## 2022-12-04 NOTE — Patient Instructions (Signed)
I value your feedback and you entrusting us with your care. If you get a [facility] patient survey, I would appreciate you taking the time to let us know about your experience today. Thank you! ? ? ?

## 2022-12-05 LAB — LIPID PANEL
Chol/HDL Ratio: 3.5 ratio (ref 0.0–4.4)
Cholesterol, Total: 209 mg/dL — ABNORMAL HIGH (ref 100–199)
HDL: 60 mg/dL (ref >39)
LDL Chol Calc (NIH): 129 mg/dL — ABNORMAL HIGH (ref 0–99)
Triglycerides: 114 mg/dL (ref 0–149)
VLDL Cholesterol Cal: 20 mg/dL (ref 5–40)

## 2022-12-05 LAB — COMPREHENSIVE METABOLIC PANEL
ALT: 21 IU/L (ref 0–32)
AST: 19 IU/L (ref 0–40)
Albumin/Globulin Ratio: 2.6 — ABNORMAL HIGH (ref 1.2–2.2)
Albumin: 4.9 g/dL (ref 3.8–4.9)
Alkaline Phosphatase: 76 IU/L (ref 44–121)
BUN/Creatinine Ratio: 13 (ref 9–23)
BUN: 10 mg/dL (ref 6–24)
Bilirubin Total: 0.3 mg/dL (ref 0.0–1.2)
CO2: 23 mmol/L (ref 20–29)
Calcium: 9.9 mg/dL (ref 8.7–10.2)
Chloride: 102 mmol/L (ref 96–106)
Creatinine, Ser: 0.79 mg/dL (ref 0.57–1.00)
Globulin, Total: 1.9 g/dL (ref 1.5–4.5)
Glucose: 97 mg/dL (ref 70–99)
Potassium: 4.3 mmol/L (ref 3.5–5.2)
Sodium: 140 mmol/L (ref 134–144)
Total Protein: 6.8 g/dL (ref 6.0–8.5)
eGFR: 89 mL/min/{1.73_m2} (ref >59)

## 2022-12-06 LAB — CYTOLOGY - PAP
Comment: NEGATIVE
Diagnosis: UNDETERMINED — AB
High risk HPV: NEGATIVE

## 2023-01-08 ENCOUNTER — Other Ambulatory Visit: Payer: Self-pay | Admitting: Obstetrics and Gynecology

## 2023-01-17 DIAGNOSIS — L538 Other specified erythematous conditions: Secondary | ICD-10-CM | POA: Diagnosis not present

## 2023-01-17 DIAGNOSIS — L728 Other follicular cysts of the skin and subcutaneous tissue: Secondary | ICD-10-CM | POA: Diagnosis not present

## 2023-01-17 DIAGNOSIS — R208 Other disturbances of skin sensation: Secondary | ICD-10-CM | POA: Diagnosis not present

## 2023-01-17 DIAGNOSIS — L720 Epidermal cyst: Secondary | ICD-10-CM | POA: Diagnosis not present

## 2023-02-05 ENCOUNTER — Ambulatory Visit
Admission: RE | Admit: 2023-02-05 | Discharge: 2023-02-05 | Disposition: A | Discharge status: Home / Self Care | Payer: BC Managed Care – PPO | Source: Ambulatory Visit | Attending: Obstetrics and Gynecology | Admitting: Obstetrics and Gynecology

## 2023-02-05 DIAGNOSIS — Z1231 Encounter for screening mammogram for malignant neoplasm of breast: Secondary | ICD-10-CM | POA: Diagnosis not present

## 2023-02-07 ENCOUNTER — Other Ambulatory Visit: Payer: Self-pay | Admitting: Obstetrics and Gynecology

## 2023-02-07 DIAGNOSIS — R928 Other abnormal and inconclusive findings on diagnostic imaging of breast: Secondary | ICD-10-CM

## 2023-02-14 ENCOUNTER — Ambulatory Visit: Admission: RE | Admit: 2023-02-14 | Payer: BC Managed Care – PPO | Source: Ambulatory Visit

## 2023-02-14 ENCOUNTER — Ambulatory Visit
Admission: RE | Admit: 2023-02-14 | Discharge: 2023-02-14 | Disposition: A | Discharge status: Home / Self Care | Payer: BC Managed Care – PPO | Source: Ambulatory Visit | Attending: Obstetrics and Gynecology | Admitting: Obstetrics and Gynecology

## 2023-02-14 DIAGNOSIS — R928 Other abnormal and inconclusive findings on diagnostic imaging of breast: Secondary | ICD-10-CM | POA: Diagnosis not present

## 2023-02-22 ENCOUNTER — Ambulatory Visit: Payer: Self-pay

## 2023-02-22 DIAGNOSIS — W5503XA Scratched by cat, initial encounter: Secondary | ICD-10-CM | POA: Diagnosis not present

## 2023-02-22 DIAGNOSIS — S60511A Abrasion of right hand, initial encounter: Secondary | ICD-10-CM | POA: Diagnosis not present

## 2023-02-22 DIAGNOSIS — Z23 Encounter for immunization: Secondary | ICD-10-CM | POA: Diagnosis not present

## 2023-02-22 NOTE — Telephone Encounter (Signed)
  Chief Complaint: Cat bite /scratch Symptoms: Blood blister at site Frequency: 2 days ago Pertinent Negatives: Patient denies  Disposition: [] ED /[x] Urgent Care (no appt availability in office) / [] Appointment(In office/virtual)/ []  [facility]/ [] Home Care/ [] Refused Recommended Disposition /[] [facility]/ []  Follow-up with PCP Additional Notes: Pt was bitten or scratched by her cat. Pt is unsure if cat is up to date on rabies vaccine. Cat is an Personal assistant. Pt is unsure when her last tetanus shot was given.  Pt would like someone to review chart to see when last tetanus was given and send her that information via MyChart. No appts in office. Advised UC.  Reason for Disposition  [1] Puncture wound or small cut AND [2] on hands or genitals  (Exception: Puncture  from small pet such as gerbil, mouse, hamster, puppy or turtle.)  Answer Assessment - Initial Assessment Questions 1. ANIMAL: "What type of animal caused the bite?" "Is the injury from a bite or a claw?" If the animal is a dog or a cat, ask: "Was it a pet or a stray?" "Was it acting ill or behaving strangely?"     Cat - unsure if bite or scratch 2. LOCATION: "Where is the bite located?"      hand 3. SIZE: "How big is the bite?" "What does it look like?"      Blood blister 4. ONSET: "When did the bite happen?" (Minutes or hours ago)      yesterday 5. CIRCUMSTANCES: "Tell me how this happened."      Cat  6. TETANUS: "When was your last tetanus booster?"     unknown 7. RABIES VACCINE: For dog or cat bites, ask: "Do you know if the pet is vaccinated against rabies?"  (e.g., yes, no, overdue for rabies shot, unknown)     Unsure  Protocols used: Animal Bite-A-AH

## 2023-02-25 NOTE — Telephone Encounter (Signed)
Attempted to reach patient, LVM to let her know to call the office to get scheduled if she still needs medical attention.  Put in CRM.

## 2023-02-25 NOTE — Telephone Encounter (Signed)
She would need a Tdap booster and apt to make sure the site isn't infected.

## 2023-05-01 ENCOUNTER — Other Ambulatory Visit: Payer: Self-pay | Admitting: Obstetrics and Gynecology

## 2023-08-05 DIAGNOSIS — C44612 Basal cell carcinoma of skin of right upper limb, including shoulder: Secondary | ICD-10-CM | POA: Diagnosis not present

## 2023-08-05 DIAGNOSIS — D2271 Melanocytic nevi of right lower limb, including hip: Secondary | ICD-10-CM | POA: Diagnosis not present

## 2023-08-05 DIAGNOSIS — D2261 Melanocytic nevi of right upper limb, including shoulder: Secondary | ICD-10-CM | POA: Diagnosis not present

## 2023-08-05 DIAGNOSIS — D2262 Melanocytic nevi of left upper limb, including shoulder: Secondary | ICD-10-CM | POA: Diagnosis not present

## 2023-08-05 DIAGNOSIS — D485 Neoplasm of uncertain behavior of skin: Secondary | ICD-10-CM | POA: Diagnosis not present

## 2023-08-05 DIAGNOSIS — C44519 Basal cell carcinoma of skin of other part of trunk: Secondary | ICD-10-CM | POA: Diagnosis not present

## 2023-08-05 DIAGNOSIS — D2272 Melanocytic nevi of left lower limb, including hip: Secondary | ICD-10-CM | POA: Diagnosis not present

## 2023-09-03 DIAGNOSIS — C44612 Basal cell carcinoma of skin of right upper limb, including shoulder: Secondary | ICD-10-CM | POA: Diagnosis not present

## 2023-09-03 DIAGNOSIS — C44519 Basal cell carcinoma of skin of other part of trunk: Secondary | ICD-10-CM | POA: Diagnosis not present

## 2023-09-05 ENCOUNTER — Other Ambulatory Visit: Payer: Self-pay | Admitting: Obstetrics and Gynecology

## 2023-10-07 ENCOUNTER — Ambulatory Visit: Payer: Self-pay | Admitting: Nurse Practitioner

## 2023-10-07 ENCOUNTER — Observation Stay
Admission: EM | Admit: 2023-10-07 | Discharge: 2023-10-08 | Disposition: A | Discharge status: Home / Self Care | Attending: Internal Medicine | Admitting: Internal Medicine

## 2023-10-07 ENCOUNTER — Emergency Department

## 2023-10-07 DIAGNOSIS — I2089 Other forms of angina pectoris: Secondary | ICD-10-CM

## 2023-10-07 DIAGNOSIS — Z79899 Other long term (current) drug therapy: Secondary | ICD-10-CM | POA: Insufficient documentation

## 2023-10-07 DIAGNOSIS — R079 Chest pain, unspecified: Secondary | ICD-10-CM

## 2023-10-07 DIAGNOSIS — I160 Hypertensive urgency: Principal | ICD-10-CM

## 2023-10-07 DIAGNOSIS — R0789 Other chest pain: Secondary | ICD-10-CM | POA: Diagnosis not present

## 2023-10-07 DIAGNOSIS — R7989 Other specified abnormal findings of blood chemistry: Secondary | ICD-10-CM | POA: Diagnosis not present

## 2023-10-07 DIAGNOSIS — R778 Other specified abnormalities of plasma proteins: Secondary | ICD-10-CM | POA: Diagnosis not present

## 2023-10-07 HISTORY — DX: Dyspnea, unspecified: R06.00

## 2023-10-07 LAB — BASIC METABOLIC PANEL
Anion gap: 9 (ref 5–15)
BUN: 9 mg/dL (ref 6–20)
CO2: 25 mmol/L (ref 22–32)
Calcium: 9.3 mg/dL (ref 8.9–10.3)
Chloride: 105 mmol/L (ref 98–111)
Creatinine, Ser: 0.67 mg/dL (ref 0.44–1.00)
GFR, Estimated: >60 mL/min (ref >60)
Glucose, Bld: 104 mg/dL — ABNORMAL HIGH (ref 70–99)
Potassium: 4 mmol/L (ref 3.5–5.1)
Sodium: 139 mmol/L (ref 135–145)

## 2023-10-07 LAB — CBC
HCT: 36.5 % (ref 36.0–46.0)
Hemoglobin: 12.2 g/dL (ref 12.0–15.0)
MCH: 30 pg (ref 26.0–34.0)
MCHC: 33.4 g/dL (ref 30.0–36.0)
MCV: 89.7 fL (ref 80.0–100.0)
Platelets: 281 10*3/uL (ref 150–400)
RBC: 4.07 MIL/uL (ref 3.87–5.11)
RDW: 13.7 % (ref 11.5–15.5)
WBC: 9.3 10*3/uL (ref 4.0–10.5)
nRBC: 0 % (ref 0.0–0.2)

## 2023-10-07 LAB — URINE DRUG SCREEN, QUALITATIVE (ARMC ONLY)
Amphetamines, Ur Screen: NOT DETECTED
Barbiturates, Ur Screen: NOT DETECTED
Benzodiazepine, Ur Scrn: NOT DETECTED
Cannabinoid 50 Ng, Ur ~~LOC~~: NOT DETECTED
Cocaine Metabolite,Ur ~~LOC~~: NOT DETECTED
MDMA (Ecstasy)Ur Screen: NOT DETECTED
Methadone Scn, Ur: NOT DETECTED
Opiate, Ur Screen: NOT DETECTED
Phencyclidine (PCP) Ur S: NOT DETECTED
Tricyclic, Ur Screen: NOT DETECTED

## 2023-10-07 LAB — TROPONIN I (HIGH SENSITIVITY)
Troponin I (High Sensitivity): 73 ng/L — ABNORMAL HIGH (ref <18)
Troponin I (High Sensitivity): 66 ng/L — ABNORMAL HIGH (ref <18)

## 2023-10-07 LAB — HIV ANTIBODY (ROUTINE TESTING W REFLEX): HIV Screen 4th Generation wRfx: NONREACTIVE

## 2023-10-07 LAB — LIPID PANEL
Cholesterol: 182 mg/dL (ref 0–200)
HDL: 55 mg/dL (ref >40)
LDL Cholesterol: 102 mg/dL — ABNORMAL HIGH (ref 0–99)
Total CHOL/HDL Ratio: 3.3 ratio
Triglycerides: 124 mg/dL (ref <150)
VLDL: 25 mg/dL (ref 0–40)

## 2023-10-07 LAB — TSH: TSH: 3.444 u[IU]/mL (ref 0.350–4.500)

## 2023-10-07 MED ORDER — ACETAMINOPHEN 325 MG PO TABS: 650.0000 mg | ORAL_TABLET | ORAL | Status: DC | PRN

## 2023-10-07 MED ORDER — ONDANSETRON HCL 4 MG/2ML IJ SOLN: 4.0000 mg | Freq: Four times a day (QID) | INTRAMUSCULAR | Status: DC | PRN

## 2023-10-07 MED ORDER — HYDROCHLOROTHIAZIDE 12.5 MG PO TABS
12.5000 mg | ORAL_TABLET | Freq: Every day | ORAL | Status: DC
  Administered 2023-10-07 – 2023-10-08 (×2): 12.5 mg via ORAL
  Filled 2023-10-07 (×2): qty 1

## 2023-10-07 MED ORDER — LABETALOL HCL 5 MG/ML IV SOLN: 10.0000 mg | INTRAVENOUS | Status: DC | PRN

## 2023-10-07 MED ORDER — CLONAZEPAM 0.5 MG PO TABS
0.5000 mg | ORAL_TABLET | Freq: Two times a day (BID) | ORAL | Status: DC | PRN
  Administered 2023-10-07: 0.5 mg via ORAL
  Filled 2023-10-07: qty 1

## 2023-10-07 MED ORDER — ASPIRIN 81 MG PO TBEC
81.0000 mg | DELAYED_RELEASE_TABLET | Freq: Every day | ORAL | Status: DC
  Administered 2023-10-08: 81 mg via ORAL
  Filled 2023-10-07: qty 1

## 2023-10-07 MED ORDER — ALBUTEROL SULFATE (2.5 MG/3ML) 0.083% IN NEBU: 2.5000 mg | INHALATION_SOLUTION | Freq: Four times a day (QID) | RESPIRATORY_TRACT | Status: DC | PRN

## 2023-10-07 MED ORDER — ENOXAPARIN SODIUM 40 MG/0.4ML IJ SOSY
40.0000 mg | PREFILLED_SYRINGE | INTRAMUSCULAR | Status: DC
  Administered 2023-10-07: 40 mg via SUBCUTANEOUS
  Filled 2023-10-07: qty 0.4

## 2023-10-07 MED ORDER — ASPIRIN 81 MG PO CHEW
324.0000 mg | CHEWABLE_TABLET | Freq: Once | ORAL | Status: AC
  Administered 2023-10-07: 324 mg via ORAL
  Filled 2023-10-07: qty 4

## 2023-10-07 MED ORDER — LISINOPRIL 10 MG PO TABS
10.0000 mg | ORAL_TABLET | Freq: Every day | ORAL | Status: DC
  Administered 2023-10-07 – 2023-10-08 (×2): 10 mg via ORAL
  Filled 2023-10-07 (×2): qty 1

## 2023-10-07 MED ORDER — LABETALOL HCL 5 MG/ML IV SOLN
20.0000 mg | Freq: Once | INTRAVENOUS | Status: AC
  Administered 2023-10-07: 20 mg via INTRAVENOUS
  Filled 2023-10-07: qty 4

## 2023-10-07 NOTE — ED Provider Notes (Signed)
 [facility] Provider Note    Event Date/Time   First MD Initiated Contact with Patient 10/07/23 1140     (approximate)   History   Hypertension   HPI  Jenna James is a 53 y.o. female with no significant past medical history who presents with complaints of elevated blood pressure, she was donating blood and was notified that her blood pressure was over 190 systolic.  She also notes that she has been having intermittent chest discomfort which she had been attributing to stress.     Physical Exam   Triage Vital Signs: ED Triage Vitals  Encounter Vitals Group     BP 10/07/23 1105 (!) 198/90     Systolic BP Percentile --      Diastolic BP Percentile --      Pulse Rate 10/07/23 1105 90     Resp 10/07/23 1105 17     Temp 10/07/23 1105 98.2 F (36.8 C)     Temp Source 10/07/23 1105 Oral     SpO2 10/07/23 1105 98 %     Weight 10/07/23 1105 71.7 kg (158 lb 1.1 oz)     Height 10/07/23 1105 1.664 m (5' 5.5")     Head Circumference --      Peak Flow --      Pain Score 10/07/23 1106 1     Pain Loc --      Pain Education --      Exclude from Growth Chart --     Most recent vital signs: Vitals:   10/07/23 1456 10/07/23 1458  BP: (!) 161/96 (!) 161/96  Pulse:  74  Resp:  18  Temp:    SpO2:  100%     General: Awake, no distress.  CV:  Good peripheral perfusion.  Regular rate and rhythm Resp:  Normal effort.  Clear to auscultation bilaterally Abd:  No distention.  Other:     ED Results / Procedures / Treatments   Labs (all labs ordered are listed, but only abnormal results are displayed) Labs Reviewed  BASIC METABOLIC PANEL - Abnormal; Notable for the following components:      Result Value   Glucose, Bld 104 (*)    All other components within normal limits  LIPID PANEL - Abnormal; Notable for the following components:   LDL Cholesterol 102 (*)    All other components within normal limits  TROPONIN I (HIGH SENSITIVITY) - Abnormal;  Notable for the following components:   Troponin I (High Sensitivity) 73 (*)    All other components within normal limits  CBC  TSH  URINE DRUG SCREEN, QUALITATIVE ([facility] ONLY)  HIV ANTIBODY (ROUTINE TESTING W REFLEX)  POC URINE PREG, ED  TROPONIN I (HIGH SENSITIVITY)     EKG  ED ECG REPORT I, Jene Every, the attending physician, personally viewed and interpreted this ECG.  Date: 10/07/2023  Rhythm: normal sinus rhythm QRS Axis: normal Intervals: normal ST/T Wave abnormalities: normal Narrative Interpretation: no evidence of acute ischemia    RADIOLOGY Chest x-ray viewed interpret by me, no acute abnormality    PROCEDURES:  Critical Care performed: yes  CRITICAL CARE Performed by: Jene Every   Total critical care time: 30 minutes  Critical care time was exclusive of separately billable procedures and treating other patients.  Critical care was necessary to treat or prevent imminent or life-threatening deterioration.  Critical care was time spent personally by me on the following activities: development of treatment plan with patient and/or surrogate  as well as nursing, discussions with consultants, evaluation of patient's response to treatment, examination of patient, obtaining history from patient or surrogate, ordering and performing treatments and interventions, ordering and review of laboratory studies, ordering and review of radiographic studies, pulse oximetry and re-evaluation of patient's condition.   Procedures   MEDICATIONS ORDERED IN ED: Medications  lisinopril (ZESTRIL) tablet 10 mg (10 mg Oral Given 10/07/23 1456)  hydrochlorothiazide (HYDRODIURIL) tablet 12.5 mg (12.5 mg Oral Given 10/07/23 1456)  aspirin EC tablet 81 mg (has no administration in time range)  clonazePAM (KLONOPIN) tablet 0.5 mg (has no administration in time range)  albuterol (PROVENTIL) (2.5 MG/3ML) 0.083% nebulizer solution 2.5 mg (has no administration in time range)   acetaminophen (TYLENOL) tablet 650 mg (has no administration in time range)  ondansetron (ZOFRAN) injection 4 mg (has no administration in time range)  enoxaparin (LOVENOX) injection 40 mg (40 mg Subcutaneous Given 10/07/23 1457)  labetalol (NORMODYNE) injection 10 mg (has no administration in time range)  aspirin chewable tablet 324 mg (324 mg Oral Given 10/07/23 1343)  labetalol (NORMODYNE) injection 20 mg (20 mg Intravenous Given 10/07/23 1343)     IMPRESSION / MDM / ASSESSMENT AND PLAN / ED COURSE  I reviewed the triage vital signs and the nursing notes. Patient's presentation is most consistent with acute presentation with potential threat to life or bodily function.  Patient presents with hypertension, chest pain as detailed above.  Chest pain is mostly improved at this time however blood pressure is elevated significantly.  Differential includes ACS, angina, hypertensive urgency  Notably her troponin is elevated at 73, coupled with high blood pressure concerning for hypertension urgency/emergency  Will treat with IV labetalol, p.o. aspirin  Have discussed with the hospitalist for admission        FINAL CLINICAL IMPRESSION(S) / ED DIAGNOSES   Final diagnoses:  Hypertensive urgency  Elevated troponin     Rx / DC Orders   ED Discharge Orders     None        Note:  This document was prepared using Dragon voice recognition software and may include unintentional dictation errors.   Jene Every, MD 10/07/23 [phone]

## 2023-10-07 NOTE — H&P (Signed)
 History and Physical    Jenna James ZOX:[id number] DOB: 03/09/1971 DOA: 10/07/2023  PCP: Larae Grooms, NP (Confirm with patient/family/NH records and if not entered, this has to be entered at [facility] point of entry) Patient coming from: Home  I have personally briefly reviewed patient's old medical records in [facility] Link  Chief Complaint: Chest pain  HPI: Jenna James is a 53 y.o. female with medical history significant of anxiety, presented with intermittent chest pains and high blood pressure.  Patient has had intermittent chest pain, " squeezing-like" repeatable separately when she fell anxious started several weeks ago.  Last Friday she went to have blood work, which was canceled due to " blood pressure too high".  Over the weekend she had several similar episodes of chest pains, centrally located nonradiating, 3-4/10, lasted for few minutes associated with palpitations, no exacerbation no alleviating factors.  Denied any nocturnal pain.  Grandfather had a heart attack at his 97s.  ED Course: Afebrile, blood pressure 198/90, heart rate 90, O2 saturation 98% on room air.  Chest x-ray negative for acute infiltrates, troponin 78, BUN 9 creatinine 0.6, hemoglobin 12.2, WBC 9.3. Patient was given labetalol x 1 in the ED.   Review of Systems: As per HPI otherwise 14 point review of systems negative.    Past Medical History:  Diagnosis Date   Anxiety     Past Surgical History:  Procedure Laterality Date   ANTERIOR CRUCIATE LIGAMENT REPAIR Left    left knee   BREAST SURGERY     cyst removed   COLONOSCOPY  11/2019     reports that she has never smoked. She has never used smokeless tobacco. She reports current alcohol use of about 1.0 - 2.0 standard drink of alcohol per week. She reports that she does not use drugs.  Allergies  Allergen Reactions   Citalopram Other (See Comments)    Family History  Problem Relation Age of Onset   Prostate cancer Father    Colon cancer  Maternal Aunt    Breast cancer Paternal Grandmother        66s?     Prior to Admission medications   Medication Sig Start Date End Date Taking? Authorizing Provider  albuterol (VENTOLIN HFA) 108 (90 Base) MCG/ACT inhaler Inhale 2 puffs into the lungs every 6 (six) hours as needed for wheezing or shortness of breath. 06/05/19   Particia Nearing, PA-C  clobetasol cream (TEMOVATE) 0.05 % Apply topically 2 (two) times daily as needed. 08/02/22   [provider]  clonazePAM (KLONOPIN) 0.5 MG tablet TAKE 1 TABLET(0.5 MG) BY MOUTH DAILY AS NEEDED FOR ANXIETY 09/05/23   Copland, Ilona Sorrel, PA-C  levonorgestrel (MIRENA) 20 MCG/24HR IUD by Intrauterine route.    [provider]  valACYclovir (VALTREX) 1000 MG tablet TAKE 2 TABLET(1000 MG) BY MOUTH BID for 1 day prn sx 06/08/21   Copland, Ilona Sorrel, PA-C    Physical Exam: Vitals:   10/07/23 1105  BP: (!) 198/90  Pulse: 90  Resp: 17  Temp: 98.2 F (36.8 C)  TempSrc: Oral  SpO2: 98%  Weight: 71.7 kg  Height: 5' 5.5" (1.664 m)    Constitutional: NAD, calm, comfortable Vitals:   10/07/23 1105  BP: (!) 198/90  Pulse: 90  Resp: 17  Temp: 98.2 F (36.8 C)  TempSrc: Oral  SpO2: 98%  Weight: 71.7 kg  Height: 5' 5.5" (1.664 m)   Eyes: PERRL, lids and conjunctivae normal ENMT: Mucous membranes are moist. Posterior pharynx clear of  any exudate or lesions.Normal dentition.  Neck: normal, supple, no masses, no thyromegaly Respiratory: clear to auscultation bilaterally, no wheezing, no crackles. Normal respiratory effort. No accessory muscle use.  Cardiovascular: Regular rate and rhythm, no murmurs / rubs / gallops. No extremity edema. 2+ pedal pulses. No carotid bruits.  Abdomen: no tenderness, no masses palpated. No hepatosplenomegaly. Bowel sounds positive.  Musculoskeletal: no clubbing / cyanosis. No joint deformity upper and lower extremities. Good ROM, no contractures. Normal muscle tone.  Skin: no rashes, lesions,  ulcers. No induration Neurologic: CN 2-12 grossly intact. Sensation intact, DTR normal. Strength 5/5 in all 4.  Psychiatric: Normal judgment and insight. Alert and oriented x 3. Normal mood.     Labs on Admission: I have personally reviewed following labs and imaging studies  CBC: Recent Labs  Lab 10/07/23 1109  WBC 9.3  HGB 12.2  HCT 36.5  MCV 89.7  PLT 281   Basic Metabolic Panel: Recent Labs  Lab 10/07/23 1109  NA 139  K 4.0  CL 105  CO2 25  GLUCOSE 104*  BUN 9  CREATININE 0.67  CALCIUM 9.3   GFR: Estimated Creatinine Clearance: 79.8 mL/min (by C-G formula based on SCr of 0.67 mg/dL). Liver Function Tests: No results for input(s): "AST", "ALT", "ALKPHOS", "BILITOT", "PROT", "ALBUMIN" in the last 168 hours. No results for input(s): "LIPASE", "AMYLASE" in the last 168 hours. No results for input(s): "AMMONIA" in the last 168 hours. Coagulation Profile: No results for input(s): "INR", "PROTIME" in the last 168 hours. Cardiac Enzymes: No results for input(s): "CKTOTAL", "CKMB", "CKMBINDEX", "TROPONINI" in the last 168 hours. BNP (last 3 results) No results for input(s): "PROBNP" in the last 8760 hours. HbA1C: No results for input(s): "HGBA1C" in the last 72 hours. CBG: No results for input(s): "GLUCAP" in the last 168 hours. Lipid Profile: No results for input(s): "CHOL", "HDL", "LDLCALC", "TRIG", "CHOLHDL", "LDLDIRECT" in the last 72 hours. Thyroid Function Tests: No results for input(s): "TSH", "T4TOTAL", "FREET4", "T3FREE", "THYROIDAB" in the last 72 hours. Anemia Panel: No results for input(s): "VITAMINB12", "FOLATE", "FERRITIN", "TIBC", "IRON", "RETICCTPCT" in the last 72 hours. Urine analysis:    Component Value Date/Time   APPEARANCEUR Clear 11/04/2019 1457   GLUCOSEU Negative 11/04/2019 1457   BILIRUBINUR Negative 11/04/2019 1457   PROTEINUR Negative 11/04/2019 1457   NITRITE Negative 11/04/2019 1457   LEUKOCYTESUR Negative 11/04/2019 1457     Radiological Exams on Admission: No results found.  EKG: Independently reviewed.  Sinus rhythm, no acute ST changes.  Assessment/Plan Active Problems:   * No active hospital problems. *  (please populate well all problems here in Problem List. (For example, if patient is on BP meds at home and you resume or decide to hold them, it is a problem that needs to be her. Same for CAD, COPD, HLD and so on)  Anginal-like chest pain Elevated troponins -Rule out ACS.  Trend troponins, echocardiogram, consider inpatient versus outpatient cardiology consultation pending on results. -Risk factor including uncontrolled hypertension/hypertension emergency, family history of MI at young age. -Start aspirin, check TSH check lipid panel, UDS -Aggressive control of hypertension  HTN emergency -With signs of acute endorgan damage of anginal-like chest pain -Start lisinopril and hydrochlorothiazide -Outpatient follow-up with PCP for BP medication adjustment  Anxiety -Continue as needed Klonopin  DVT prophylaxis: Lovenox Code Status: Full code Family Communication: None at bedside Disposition Plan: Expect less than 2 midnight hospital stay Consults called: None Admission status: Telemetry observation   Emeline General MD [facility] Pager [phone]  10/07/2023, 2:04 PM

## 2023-10-07 NOTE — Telephone Encounter (Signed)
 Chief Complaint: HTN Symptoms: pressure in head, chest tightness Frequency: unsure Pertinent Negatives: Patient denies blurred vision, difficulty breathing, weakness Disposition: [x] ED /[] Urgent Care (no appt availability in office) / [] Appointment(In office/virtual)/ []  [facility]/ [] Home Care/ [] Refused Recommended Disposition /[] [facility]/ []  Follow-up with PCP Additional Notes: Patient called in stating she went to donate blood yesterday and was turned away due to her BP reading of 183/107. Patient checked her BP this morning with her automatic cuff and initially upon waking up it was 160/98. Patient checked it roughly 45 min later and it was 196/111. Patient states she has had intermittent chest tightness, headache, and feeling a little off with her head for quite some time, and occurring today. Patient referred to ED for evaluation and advised to follow up with PCP afterwards.    Copied from CRM [id number]. Topic: Clinical - Red Word Triage >> Oct 07, 2023  9:54 AM DeAngela L wrote: Red Word that prompted transfer to Nurse Triage: Patient states her blood pressure is high, Friday when she tried to donate blood her pressure on Friday after 183/107 and this morning it was 160/98 and again 196/111 Reason for Disposition  [1] Systolic BP  >= 160 OR Diastolic >= 100 AND [2] cardiac (e.g., breathing difficulty, chest pain) or neurologic symptoms (e.g., new-onset blurred or double vision, unsteady gait)  Answer Assessment - Initial Assessment Questions 1. BLOOD PRESSURE: "What is the blood pressure?" "Did you take at least two measurements 5 minutes apart?"     0800 160/98, 0840- 196/111  2. ONSET: "When did you take your blood pressure?"     Friday 3. HOW: "How did you take your blood pressure?" (e.g., automatic home BP monitor, visiting nurse)     Automatic cuff on upper arm 4. HISTORY: "Do you have a history of high blood pressure?"     Never been actually diagnosed  with HTN 5. MEDICINES: "Are you taking any medicines for blood pressure?" "Have you missed any doses recently?"     No 6. OTHER SYMPTOMS: "Do you have any symptoms?" (e.g., blurred vision, chest pain, difficulty breathing, headache, weakness)     Chest tightness, headaches  Protocols used: Blood Pressure - High-A-AH

## 2023-10-07 NOTE — ED Triage Notes (Addendum)
 Pt here with hypertension. Pt states she went to give blood Friday and her bp was 183/87. Pt does not have a hx of hypertension. Pt denies SOB but some mild chest pressure. Pt also took several sleep aids at 0400 this morning.

## 2023-10-08 ENCOUNTER — Encounter: Payer: Self-pay | Admitting: Internal Medicine

## 2023-10-08 ENCOUNTER — Observation Stay (HOSPITAL_BASED_OUTPATIENT_CLINIC_OR_DEPARTMENT_OTHER)
Admit: 2023-10-08 | Discharge: 2023-10-08 | Disposition: A | Discharge status: Home / Self Care | Attending: Internal Medicine

## 2023-10-08 ENCOUNTER — Other Ambulatory Visit: Payer: Self-pay

## 2023-10-08 DIAGNOSIS — I160 Hypertensive urgency: Principal | ICD-10-CM

## 2023-10-08 DIAGNOSIS — R7989 Other specified abnormal findings of blood chemistry: Secondary | ICD-10-CM | POA: Diagnosis not present

## 2023-10-08 DIAGNOSIS — R079 Chest pain, unspecified: Secondary | ICD-10-CM

## 2023-10-08 DIAGNOSIS — I2089 Other forms of angina pectoris: Secondary | ICD-10-CM | POA: Diagnosis not present

## 2023-10-08 DIAGNOSIS — R0789 Other chest pain: Secondary | ICD-10-CM | POA: Diagnosis not present

## 2023-10-08 DIAGNOSIS — Z79899 Other long term (current) drug therapy: Secondary | ICD-10-CM | POA: Diagnosis not present

## 2023-10-08 LAB — ECHOCARDIOGRAM COMPLETE
AR max vel: 2.49 cm2
AV Area VTI: 2.46 cm2
AV Area mean vel: 2.54 cm2
AV Mean grad: 3 mmHg
AV Peak grad: 6 mmHg
Ao pk vel: 1.22 m/s
Area-P 1/2: 3.5 cm2
Calc EF: 62.3 %
Height: 65.5 in
MV VTI: 2.04 cm2
S' Lateral: 3 cm
Single Plane A2C EF: 61.4 %
Single Plane A4C EF: 63.9 %
Weight: 2529.12 [oz_av]

## 2023-10-08 LAB — BASIC METABOLIC PANEL
Anion gap: 10 (ref 5–15)
BUN: 12 mg/dL (ref 6–20)
CO2: 22 mmol/L (ref 22–32)
Calcium: 9.1 mg/dL (ref 8.9–10.3)
Chloride: 105 mmol/L (ref 98–111)
Creatinine, Ser: 0.69 mg/dL (ref 0.44–1.00)
GFR, Estimated: >60 mL/min (ref >60)
Glucose, Bld: 93 mg/dL (ref 70–99)
Potassium: 3.4 mmol/L — ABNORMAL LOW (ref 3.5–5.1)
Sodium: 137 mmol/L (ref 135–145)

## 2023-10-08 LAB — HEMOGLOBIN A1C
Hgb A1c MFr Bld: 5.2 % (ref 4.8–5.6)
Mean Plasma Glucose: 102.54 mg/dL

## 2023-10-08 MED ORDER — ASPIRIN 81 MG PO TBEC: 81.0000 mg | DELAYED_RELEASE_TABLET | Freq: Every day | ORAL | 12 refills | Status: AC

## 2023-10-08 MED ORDER — LISINOPRIL 20 MG PO TABS: 20.0000 mg | ORAL_TABLET | Freq: Every day | ORAL | 1 refills | Status: DC

## 2023-10-08 MED ORDER — HYDROCHLOROTHIAZIDE 12.5 MG PO TABS: 12.5000 mg | ORAL_TABLET | Freq: Every day | ORAL | 1 refills | Status: DC

## 2023-10-08 NOTE — Hospital Course (Addendum)
 Taken from H&P.  Jenna James is a 53 y.o. female with medical history significant of anxiety, presented with intermittent chest pains and high blood pressure.   On presentation patient had elevated blood pressure at 198/90, troponin 78, rest of the labs stable.  Chest x-ray negative for any acute infiltrate.  Patient was given 1 dose of labetalol in ED and admitted to rule out ACS. Patient has positive family history of CAD at young age.  3/11: Vital stable, BMP with potassium of 3.4, repeat troponin with a downward trend at 63, UDS negative, lipid panel with LDL of 102, TSH normal.  Echocardiogram with normal EF, mild LVH and grade 2 diastolic dysfunction.  Case was discussed with Dr. Okey Dupre from cardiology and they will arrange outpatient follow-up.  Patient was started on lisinopril 20 mg daily and HCTZ 12.5 mg daily.  She need to have a close follow-up with outpatient providers for titration of her antihypertensives.  Patient likely had undiagnosed hypertension as she does not follow-up with primary care provider regularly.  She does has a family history of coronary artery disease and cardiology will do further evaluation as outpatient.  She did not had any more chest pain and current chest discomfort was likely due to significantly elevated blood pressure which responded pretty quickly with medications.  Patient was also started on baby aspirin.  She will continue current medications and need to have a close follow-up with her providers for further assistance.

## 2023-10-08 NOTE — Discharge Summary (Signed)
 Physician Discharge Summary   Patient: Jenna James MRN: [id number] DOB: 1971-07-21  Admit date:     10/07/2023  Discharge date: 10/08/23  Discharge Physician: Arnetha Courser   PCP: Larae Grooms, NP   Recommendations at discharge:  Please obtain CBC and BMP on follow-up Patient is being started on lisinopril and HCTZ for undiagnosed hypertension-please titrate the dose Follow-up with primary care provider Follow-up with cardiology  Discharge Diagnoses: Principal Problem:   Chest pain Active Problems:   Elevated troponin   Hypertensive urgency  Hospital Course: Taken from H&P.  Jenna James is a 53 y.o. female with medical history significant of anxiety, presented with intermittent chest pains and high blood pressure.   On presentation patient had elevated blood pressure at 198/90, troponin 78, rest of the labs stable.  Chest x-ray negative for any acute infiltrate.  Patient was given 1 dose of labetalol in ED and admitted to rule out ACS. Patient has positive family history of CAD at young age.  3/11: Vital stable, BMP with potassium of 3.4, repeat troponin with a downward trend at 63, UDS negative, lipid panel with LDL of 102, TSH normal.  Echocardiogram with normal EF, mild LVH and grade 2 diastolic dysfunction.  Case was discussed with Dr. Okey Dupre from cardiology and they will arrange outpatient follow-up.  Patient was started on lisinopril 20 mg daily and HCTZ 12.5 mg daily.  She need to have a close follow-up with outpatient providers for titration of her antihypertensives.  Patient likely had undiagnosed hypertension as she does not follow-up with primary care provider regularly.  She does has a family history of coronary artery disease and cardiology will do further evaluation as outpatient.  She did not had any more chest pain and current chest discomfort was likely due to significantly elevated blood pressure which responded pretty quickly with medications.  Patient was  also started on baby aspirin.  She will continue current medications and need to have a close follow-up with her providers for further assistance.   Consultants: Curbside cardiology Procedures performed: None Disposition: Home Diet recommendation:  Discharge Diet Orders (From admission, onward)     Start     Ordered   10/08/23 0000  Diet - low sodium heart healthy        10/08/23 1516           Cardiac diet DISCHARGE MEDICATION: Allergies as of 10/08/2023       Reactions   Citalopram Other (See Comments)        Medication List     STOP taking these medications    clobetasol cream 0.05 % Commonly known as: TEMOVATE       TAKE these medications    albuterol 108 (90 Base) MCG/ACT inhaler Commonly known as: VENTOLIN HFA Inhale 2 puffs into the lungs every 6 (six) hours as needed for wheezing or shortness of breath.   aspirin EC 81 MG tablet Take 1 tablet (81 mg total) by mouth daily. Swallow whole. Start taking on: October 09, 2023   clonazePAM 0.5 MG tablet Commonly known as: KLONOPIN TAKE 1 TABLET(0.5 MG) BY MOUTH DAILY AS NEEDED FOR ANXIETY   hydrochlorothiazide 12.5 MG tablet Commonly known as: HYDRODIURIL Take 1 tablet (12.5 mg total) by mouth daily. Start taking on: October 09, 2023   levonorgestrel 20 MCG/24HR IUD Commonly known as: MIRENA by Intrauterine route.   lisinopril 20 MG tablet Commonly known as: ZESTRIL Take 1 tablet (20 mg total) by mouth daily. Start taking on: October 09, 2023   triamcinolone cream 0.1 % Commonly known as: KENALOG Apply 1 Application topically 2 (two) times daily.   valACYclovir 1000 MG tablet Commonly known as: VALTREX TAKE 2 TABLET(1000 MG) BY MOUTH BID for 1 day prn sx        Follow-up Information     End, Christopher, MD. Schedule an appointment as soon as possible for a visit in 1 week(s).   Specialty: Cardiology Contact information: [address] [place] [address] [phone]          Larae Grooms, NP. Schedule an appointment as soon as possible for a visit in 1 week(s).   Specialty: Nurse Practitioner Contact information: [address] [place] [address] [phone]                Discharge Exam: Ceasar Mons Weights   10/07/23 1105  Weight: 71.7 kg   General.  Well-developed lady, in no acute distress. Pulmonary.  Lungs clear bilaterally, normal respiratory effort. CV.  Regular rate and rhythm, no JVD, rub or murmur. Abdomen.  Soft, nontender, nondistended, BS positive. CNS.  Alert and oriented .  No focal neurologic deficit. Extremities.  No edema, no cyanosis, pulses intact and symmetrical. Psychiatry.  Judgment and insight appears normal.   Condition at discharge: stable  The results of significant diagnostics from this hospitalization (including imaging, microbiology, ancillary and laboratory) are listed below for reference.   Imaging Studies: ECHOCARDIOGRAM COMPLETE Result Date: 10/08/2023    ECHOCARDIOGRAM REPORT   Patient Name:   Jenna James Date of Exam: 10/08/2023 Medical Rec #:  [id number]   Height:       65.5 in Accession #:    [id number]  Weight:       158.1 lb Date of Birth:  1971-01-09   BSA:          1.800 m Patient Age:    53 years    BP:           127/78 mmHg Patient Gender: F           HR:           68 bpm. Exam Location:  [facility] Procedure: 2D Echo, Cardiac Doppler, Color Doppler and Strain Analysis (Both            Spectral and Color Flow Doppler were utilized during procedure). Indications:     Chest Pain  History:         Patient has no prior history of Echocardiogram examinations.                  Signs/Symptoms:Chest Pain.  Sonographer:     Mikki Harbor Referring Phys:  [id number] Emeline General Diagnosing Phys: Yvonne Kendall MD  Sonographer Comments: Global longitudinal strain was attempted. IMPRESSIONS  1. Left ventricular ejection fraction, by estimation, is 60 to 65%. The left ventricle has normal function. The left ventricle  has no regional wall motion abnormalities. There is mild left ventricular hypertrophy. Left ventricular diastolic parameters are consistent with Grade II diastolic dysfunction (pseudonormalization). The average left ventricular global longitudinal strain is -15.5 %. The global longitudinal strain is abnormal.  2. Right ventricular systolic function is normal. The right ventricular size is normal. There is normal pulmonary artery systolic pressure.  3. The mitral valve is normal in structure. Mild mitral valve regurgitation. No evidence of mitral stenosis.  4. The aortic valve is tricuspid. Aortic valve regurgitation is trivial.  5. The inferior vena cava is normal in size  with greater than 50% respiratory variability, suggesting right atrial pressure of 3 mmHg. FINDINGS  Left Ventricle: Left ventricular ejection fraction, by estimation, is 60 to 65%. The left ventricle has normal function. The left ventricle has no regional wall motion abnormalities. The average left ventricular global longitudinal strain is -15.5 %. Strain was performed and the global longitudinal strain is abnormal. The left ventricular internal cavity size was normal in size. There is mild left ventricular hypertrophy. Left ventricular diastolic parameters are consistent with Grade II diastolic dysfunction (pseudonormalization). Right Ventricle: The right ventricular size is normal. No increase in right ventricular wall thickness. Right ventricular systolic function is normal. There is normal pulmonary artery systolic pressure. The tricuspid regurgitant velocity is 2.58 m/s, and  with an assumed right atrial pressure of 3 mmHg, the estimated right ventricular systolic pressure is 29.6 mmHg. Left Atrium: Left atrial size was normal in size. Right Atrium: Right atrial size was normal in size. Pericardium: There is no evidence of pericardial effusion. Mitral Valve: The mitral valve is normal in structure. Mild mitral valve regurgitation. No evidence  of mitral valve stenosis. MV peak gradient, 4.4 mmHg. The mean mitral valve gradient is 2.0 mmHg. Tricuspid Valve: The tricuspid valve is grossly normal. Tricuspid valve regurgitation is trivial. Aortic Valve: The aortic valve is tricuspid. Aortic valve regurgitation is trivial. Aortic valve mean gradient measures 3.0 mmHg. Aortic valve peak gradient measures 6.0 mmHg. Aortic valve area, by VTI measures 2.46 cm. Pulmonic Valve: The pulmonic valve was not well visualized. Pulmonic valve regurgitation is not visualized. No evidence of pulmonic stenosis. Aorta: The aortic root is normal in size and structure. Pulmonary Artery: The pulmonary artery is of normal size. Venous: The inferior vena cava is normal in size with greater than 50% respiratory variability, suggesting right atrial pressure of 3 mmHg. IAS/Shunts: The interatrial septum was not well visualized.  LEFT VENTRICLE PLAX 2D LVIDd:         4.30 cm     Diastology LVIDs:         3.00 cm     LV e' medial:    6.85 cm/s LV PW:         1.14 cm     LV E/e' medial:  11.1 LV IVS:        1.11 cm     LV e' lateral:   6.96 cm/s LVOT diam:     2.00 cm     LV E/e' lateral: 10.9 LV SV:         56 LV SV Index:   31          2D Longitudinal Strain LVOT Area:     3.14 cm    2D Strain GLS Avg:     -15.5 %  LV Volumes (MOD) LV vol d, MOD A2C: 61.9 ml LV vol d, MOD A4C: 59.3 ml LV vol s, MOD A2C: 23.9 ml LV vol s, MOD A4C: 21.4 ml LV SV MOD A2C:     38.0 ml LV SV MOD A4C:     59.3 ml LV SV MOD BP:      37.7 ml RIGHT VENTRICLE RV Basal diam:  3.05 cm RV Mid diam:    2.60 cm RV S prime:     15.30 cm/s TAPSE (M-mode): 2.9 cm LEFT ATRIUM             Index        RIGHT ATRIUM           Index  LA diam:        4.00 cm 2.22 cm/m   RA Area:     14.40 cm LA Vol (A2C):   71.4 ml 39.66 ml/m  RA Volume:   35.00 ml  19.44 ml/m LA Vol (A4C):   42.1 ml 23.39 ml/m LA Biplane Vol: 56.2 ml 31.22 ml/m  AORTIC VALVE                    PULMONIC VALVE AV Area (Vmax):    2.49 cm     PV Vmax:        1.00 m/s AV Area (Vmean):   2.54 cm     PV Peak grad:  4.0 mmHg AV Area (VTI):     2.46 cm AV Vmax:           122.00 cm/s AV Vmean:          74.100 cm/s AV VTI:            0.226 m AV Peak Grad:      6.0 mmHg AV Mean Grad:      3.0 mmHg LVOT Vmax:         96.60 cm/s LVOT Vmean:        60.000 cm/s LVOT VTI:          0.177 m LVOT/AV VTI ratio: 0.78  AORTA Ao Root diam: 3.20 cm MITRAL VALVE               TRICUSPID VALVE MV Area (PHT): 3.50 cm    TR Peak grad:   26.6 mmHg MV Area VTI:   2.04 cm    TR Vmax:        258.00 cm/s MV Peak grad:  4.4 mmHg MV Mean grad:  2.0 mmHg    SHUNTS MV Vmax:       1.05 m/s    Systemic VTI:  0.18 m MV Vmean:      59.1 cm/s   Systemic Diam: 2.00 cm MV Decel Time: 217 msec MV E velocity: 75.70 cm/s MV A velocity: 55.50 cm/s MV E/A ratio:  1.36 Yvonne Kendall MD Electronically signed by Yvonne Kendall MD Signature Date/Time: 10/08/2023/3:10:10 PM    Final    DG Chest 2 View Result Date: 10/07/2023 CLINICAL DATA:  Chest pain. EXAM: CHEST - 2 VIEW COMPARISON:  12/08/2021. FINDINGS: Bilateral lung fields are clear. Bilateral costophrenic angles are clear. Normal cardio-mediastinal silhouette. No acute osseous abnormalities. The soft tissues are within normal limits. IMPRESSION: No active cardiopulmonary disease. Electronically Signed   By: Jules Schick M.D.   On: 10/07/2023 14:38    Microbiology: Results for orders placed or performed in visit on 11/22/21  Rapid Strep Screen ([facility] ONLY)     Status: None   Collection Time: 11/22/21  3:10 PM   Specimen: Other   Other  Result Value Ref Range Status   Strep Gp A Ag, IA W/Reflex Negative Negative Final  Culture, Group A Strep     Status: None   Collection Time: 11/22/21  3:10 PM   Other  Result Value Ref Range Status   Strep A Culture Negative  Final    Comment:                             Reference Range: Negative  Novel Coronavirus, NAA ([facility])     Status: None   Collection Time: 11/22/21  3:25 PM  Specimen: Nasopharyngeal(NP) swabs in vial transport medium  Result Value Ref Range Status   SARS-CoV-2, NAA Not Detected Not Detected Final    Comment: This nucleic acid amplification test was developed and its performance characteristics determined by [employer]. Nucleic acid amplification tests include RT-PCR and TMA. This test has not been FDA cleared or approved. This test has been authorized by FDA under an Emergency Use Authorization (EUA). This test is only authorized for the duration of time the declaration that circumstances exist justifying the authorization of the emergency use of in vitro diagnostic tests for detection of SARS-CoV-2 virus and/or diagnosis of COVID-19 infection under section 564(b)(1) of the Act, 21 U.S.C. 161WRU-0(A) (1), unless the authorization is terminated or revoked sooner. When diagnostic testing is negative, the possibility of a false negative result should be considered in the context of a patient's recent exposures and the presence of clinical signs and symptoms consistent with COVID-19. An individual without symptoms of COVID-19 and who is not shedding SARS-CoV-2 virus wo uld expect to have a negative (not detected) result in this assay.     Labs: CBC: Recent Labs  Lab 10/07/23 1109  WBC 9.3  HGB 12.2  HCT 36.5  MCV 89.7  PLT 281   Basic Metabolic Panel: Recent Labs  Lab 10/07/23 1109 10/08/23 0438  NA 139 137  K 4.0 3.4*  CL 105 105  CO2 25 22  GLUCOSE 104* 93  BUN 9 12  CREATININE 0.67 0.69  CALCIUM 9.3 9.1   Liver Function Tests: No results for input(s): "AST", "ALT", "ALKPHOS", "BILITOT", "PROT", "ALBUMIN" in the last 168 hours. CBG: No results for input(s): "GLUCAP" in the last 168 hours.  Discharge time spent: greater than 30 minutes.  This record has been created using Conservation officer, historic buildings. Errors have been sought and corrected,but may not always be located. Such creation errors do not reflect  on the standard of care.   Signed: Arnetha Courser, MD [facility] 10/08/2023

## 2023-10-08 NOTE — Progress Notes (Signed)
*  PRELIMINARY RESULTS* Echocardiogram 2D Echocardiogram has been performed.  Carolyne Fiscal 10/08/2023, 1:06 PM

## 2023-10-08 NOTE — ED Notes (Signed)
 Lab called this RN about a troponin that was due around 1630. This RN took over patient care at 1900 and is unsure of the reason the primary RN at the time did not collect the repeat troponin. Will collect ASAP. Lab notified at this time.

## 2023-10-15 ENCOUNTER — Ambulatory Visit (INDEPENDENT_AMBULATORY_CARE_PROVIDER_SITE_OTHER): Admitting: Nurse Practitioner

## 2023-10-15 ENCOUNTER — Encounter: Payer: Self-pay | Admitting: Nurse Practitioner

## 2023-10-15 VITALS — BP 131/89 | HR 69 | Ht 64.0 in | Wt 171.2 lb

## 2023-10-15 DIAGNOSIS — I10 Essential (primary) hypertension: Secondary | ICD-10-CM

## 2023-10-15 DIAGNOSIS — R0683 Snoring: Secondary | ICD-10-CM | POA: Diagnosis not present

## 2023-10-15 DIAGNOSIS — R0681 Apnea, not elsewhere classified: Secondary | ICD-10-CM | POA: Diagnosis not present

## 2023-10-15 NOTE — Progress Notes (Signed)
 BP 131/89 (BP Location: Left Arm, Patient Position: Sitting, Cuff Size: Large)   Pulse 69   Ht 5\' 4"  (1.626 m)   Wt 171 lb 3.2 oz (77.7 kg)   SpO2 99%   BMI 29.39 kg/m    Subjective:    Patient ID: Jenna James, female    DOB: [birth year], 53 y.o.   MRN: [id number]  HPI: Jenna James is a 53 y.o. female  Chief Complaint  Patient presents with   Hospitalization Follow-up    Patient stated she's doing better    HYPERTENSION  without  Chronic Kidney Disease Hypertension status: better  Satisfied with current treatment? no Duration of hypertension: years BP monitoring frequency:  daily BP range: 140-160/80 /BP medication side effects:  no Medication compliance: excellent compliance Previous BP meds:HCTZ and lisinopril Aspirin: no Recurrent headaches: no Visual changes: no Palpitations: no Dyspnea: no Chest pain: no Lower extremity edema: no Dizzy/lightheaded: no   Patient states she wakes up in the middle of the night feeling distressed.  Her husband has noticed her stop breathing.  She does snore nightly.    Relevant past medical, surgical, family and social history reviewed and updated as indicated. Interim medical history since our last visit reviewed. Allergies and medications reviewed and updated.  Review of Systems  Eyes:  Negative for visual disturbance.  Respiratory:  Positive for apnea. Negative for cough, chest tightness and shortness of breath.        Snoring   Cardiovascular:  Negative for chest pain, palpitations and leg swelling.  Neurological:  Negative for dizziness and headaches.    Per HPI unless specifically indicated above     Objective:    BP 131/89 (BP Location: Left Arm, Patient Position: Sitting, Cuff Size: Large)   Pulse 69   Ht 5\' 4"  (1.626 m)   Wt 171 lb 3.2 oz (77.7 kg)   SpO2 99%   BMI 29.39 kg/m   Wt Readings from Last 3 Encounters:  10/15/23 171 lb 3.2 oz (77.7 kg)  10/07/23 158 lb 1.1 oz (71.7 kg)  12/04/22 158 lb (71.7  kg)    Physical Exam Vitals and nursing note reviewed.  Constitutional:      General: She is not in acute distress.    Appearance: Normal appearance. She is normal weight. She is not ill-appearing, toxic-appearing or diaphoretic.  HENT:     Head: Normocephalic.     Right Ear: External ear normal.     Left Ear: External ear normal.     Nose: Nose normal.     Mouth/Throat:     Mouth: Mucous membranes are moist.     Pharynx: Oropharynx is clear.  Eyes:     General:        Right eye: No discharge.        Left eye: No discharge.     Extraocular Movements: Extraocular movements intact.     Conjunctiva/sclera: Conjunctivae normal.     Pupils: Pupils are equal, round, and reactive to light.  Cardiovascular:     Rate and Rhythm: Normal rate and regular rhythm.     Heart sounds: No murmur heard. Pulmonary:     Effort: Pulmonary effort is normal. No respiratory distress.     Breath sounds: Normal breath sounds. No wheezing or rales.  Musculoskeletal:     Cervical back: Normal range of motion and neck supple.  Skin:    General: Skin is warm and dry.     Capillary Refill: Capillary refill takes less  than 2 seconds.  Neurological:     General: No focal deficit present.     Mental Status: She is alert and oriented to person, place, and time. Mental status is at baseline.  Psychiatric:        Mood and Affect: Mood normal.        Behavior: Behavior normal.        Thought Content: Thought content normal.        Judgment: Judgment normal.     Results for orders placed or performed during the hospital encounter of 10/07/23  Basic metabolic panel   Collection Time: 10/07/23 11:09 AM  Result Value Ref Range   Sodium 139 135 - 145 mmol/L   Potassium 4.0 3.5 - 5.1 mmol/L   Chloride 105 98 - 111 mmol/L   CO2 25 22 - 32 mmol/L   Glucose, Bld 104 (H) 70 - 99 mg/dL   BUN 9 6 - 20 mg/dL   Creatinine, Ser 1.19 0.44 - 1.00 mg/dL   Calcium 9.3 8.9 - 14.7 mg/dL   GFR, Estimated >82 >95 mL/min    Anion gap 9 5 - 15  CBC   Collection Time: 10/07/23 11:09 AM  Result Value Ref Range   WBC 9.3 4.0 - 10.5 K/uL   RBC 4.07 3.87 - 5.11 MIL/uL   Hemoglobin 12.2 12.0 - 15.0 g/dL   HCT 62.1 30.8 - 65.7 %   MCV 89.7 80.0 - 100.0 fL   MCH 30.0 26.0 - 34.0 pg   MCHC 33.4 30.0 - 36.0 g/dL   RDW 84.6 96.2 - 95.2 %   Platelets 281 150 - 400 K/uL   nRBC 0.0 0.0 - 0.2 %  TSH   Collection Time: 10/07/23 11:09 AM  Result Value Ref Range   TSH 3.444 0.350 - 4.500 uIU/mL  Lipid panel   Collection Time: 10/07/23 11:09 AM  Result Value Ref Range   Cholesterol 182 0 - 200 mg/dL   Triglycerides 841 <324 mg/dL   HDL 55 >40 mg/dL   Total CHOL/HDL Ratio 3.3 RATIO   VLDL 25 0 - 40 mg/dL   LDL Cholesterol 102 (H) 0 - 99 mg/dL  Hemoglobin V2Z   Collection Time: 10/07/23 11:09 AM  Result Value Ref Range   Hgb A1c MFr Bld 5.2 4.8 - 5.6 %   Mean Plasma Glucose 102.54 mg/dL  Troponin I (High Sensitivity)   Collection Time: 10/07/23 11:09 AM  Result Value Ref Range   Troponin I (High Sensitivity) 73 (H) <18 ng/L  Urine Drug Screen, Qualitative ([facility] only)   Collection Time: 10/07/23  4:35 PM  Result Value Ref Range   Tricyclic, Ur Screen NONE DETECTED NONE DETECTED   Amphetamines, Ur Screen NONE DETECTED NONE DETECTED   MDMA (Ecstasy)Ur Screen NONE DETECTED NONE DETECTED   Cocaine Metabolite,Ur [place] NONE DETECTED NONE DETECTED   Opiate, Ur Screen NONE DETECTED NONE DETECTED   Phencyclidine (PCP) Ur S NONE DETECTED NONE DETECTED   Cannabinoid 50 Ng, Ur [place] NONE DETECTED NONE DETECTED   Barbiturates, Ur Screen NONE DETECTED NONE DETECTED   Benzodiazepine, Ur Scrn NONE DETECTED NONE DETECTED   Methadone Scn, Ur NONE DETECTED NONE DETECTED  HIV Antibody (routine testing w rflx)   Collection Time: 10/07/23  4:35 PM  Result Value Ref Range   HIV Screen 4th Generation wRfx Non Reactive Non Reactive  Troponin I (High Sensitivity)   Collection Time: 10/07/23 11:10 PM  Result Value Ref Range    Troponin I (High Sensitivity)  66 (H) <18 ng/L  Basic metabolic panel   Collection Time: 10/08/23  4:38 AM  Result Value Ref Range   Sodium 137 135 - 145 mmol/L   Potassium 3.4 (L) 3.5 - 5.1 mmol/L   Chloride 105 98 - 111 mmol/L   CO2 22 22 - 32 mmol/L   Glucose, Bld 93 70 - 99 mg/dL   BUN 12 6 - 20 mg/dL   Creatinine, Ser 9.62 0.44 - 1.00 mg/dL   Calcium 9.1 8.9 - 95.2 mg/dL   GFR, Estimated >84 >13 mL/min   Anion gap 10 5 - 15  ECHOCARDIOGRAM COMPLETE   Collection Time: 10/08/23  1:06 PM  Result Value Ref Range   Weight 2,529.12 oz   Height 65.5 in   BP 127/78 mmHg   Ao pk vel 1.22 m/s   AV Area VTI 2.46 cm2   AR max vel 2.49 cm2   AV Mean grad 3.0 mmHg   AV Peak grad 6.0 mmHg   Single Plane A2C EF 61.4 %   Single Plane A4C EF 63.9 %   Calc EF 62.3 %   S' Lateral 3.00 cm   AV Area mean vel 2.54 cm2   Area-P 1/2 3.50 cm2   MV VTI 2.04 cm2   Est EF 60 - 65%       Assessment & Plan:   Problem List Items Addressed This Visit       Cardiovascular and Mediastinum   Primary hypertension - Primary   New diagnosis after hypertensive urgency in the ER.  On Lisinopril and HCTZ.  Continue with current doses.  Follow up in 1 month.  Continue to check blood pressures at home.  Bring log to next visit.  Call sooner if concerns arise.  CMP checked at visit.       Relevant Orders   Comp Met (CMET)   Other Visit Diagnoses       Snoring       Sleep study ordered for evaluation of OSA.   Relevant Orders   Ambulatory referral to Sleep Studies     Apnea       Relevant Orders   Ambulatory referral to Sleep Studies        Follow up plan: Return in about 1 month (around 11/15/2023) for Physical and Fasting labs.

## 2023-10-15 NOTE — Assessment & Plan Note (Signed)
 New diagnosis after hypertensive urgency in the ER.  On Lisinopril and HCTZ.  Continue with current doses.  Follow up in 1 month.  Continue to check blood pressures at home.  Bring log to next visit.  Call sooner if concerns arise.  CMP checked at visit.

## 2023-10-16 ENCOUNTER — Encounter: Payer: Self-pay | Admitting: Cardiology

## 2023-10-16 ENCOUNTER — Encounter: Payer: Self-pay | Admitting: Nurse Practitioner

## 2023-10-16 ENCOUNTER — Ambulatory Visit: Attending: Cardiology | Admitting: Cardiology

## 2023-10-16 VITALS — BP 120/70 | HR 60 | Ht 64.5 in | Wt 172.1 lb

## 2023-10-16 DIAGNOSIS — E7800 Pure hypercholesterolemia, unspecified: Secondary | ICD-10-CM

## 2023-10-16 DIAGNOSIS — I10 Essential (primary) hypertension: Secondary | ICD-10-CM | POA: Diagnosis not present

## 2023-10-16 DIAGNOSIS — R072 Precordial pain: Secondary | ICD-10-CM | POA: Diagnosis not present

## 2023-10-16 LAB — COMPREHENSIVE METABOLIC PANEL WITH GFR
ALT: 15 IU/L (ref 0–32)
AST: 17 IU/L (ref 0–40)
Albumin: 4.7 g/dL (ref 3.8–4.9)
Alkaline Phosphatase: 77 IU/L (ref 44–121)
BUN/Creatinine Ratio: 17 (ref 9–23)
BUN: 13 mg/dL (ref 6–24)
Bilirubin Total: 0.5 mg/dL (ref 0.0–1.2)
CO2: 23 mmol/L (ref 20–29)
Calcium: 9.9 mg/dL (ref 8.7–10.2)
Chloride: 101 mmol/L (ref 96–106)
Creatinine, Ser: 0.78 mg/dL (ref 0.57–1.00)
Globulin, Total: 2.1 g/dL (ref 1.5–4.5)
Glucose: 84 mg/dL (ref 70–99)
Potassium: 4.4 mmol/L (ref 3.5–5.2)
Sodium: 138 mmol/L (ref 134–144)
Total Protein: 6.8 g/dL (ref 6.0–8.5)
eGFR: 90 mL/min/1.73

## 2023-10-16 MED ORDER — METOPROLOL TARTRATE 25 MG PO TABS: ORAL_TABLET | ORAL | 0 refills | Status: DC

## 2023-10-16 NOTE — Patient Instructions (Addendum)
 Medication Instructions:  No changes *If you need a refill on your cardiac medications before your next appointment, please call your pharmacy*   Lab Work: None ordered If you have labs (blood work) drawn today and your tests are completely normal, you will receive your results only by: MyChart Message (if you have MyChart) OR A paper copy in the mail If you have any lab test that is abnormal or we need to change your treatment, we will call you to review the results.   Follow-Up: At [facility], you and your health needs are our priority.  As part of our continuing mission to provide you with exceptional heart care, we have created designated Provider Care Teams.  These Care Teams include your primary Cardiologist (physician) and Advanced Practice Providers (APPs -  Physician Assistants and Nurse Practitioners) who all work together to provide you with the care you need, when you need it.  We recommend signing up for the patient portal called "MyChart".  Sign up information is provided on this After Visit Summary.  MyChart is used to connect with patients for Virtual Visits (Telemedicine).  Patients are able to view lab/test results, encounter notes, upcoming appointments, etc.  Non-urgent messages can be sent to your provider as well.   To learn more about what you can do with MyChart, go to [email or web address].    Your next appointment:   Follow up with Dr. Azucena Cecil or app after testing  Other Instructions   Your cardiac CT will be scheduled at one of the below locations:   [facility] [address] [place], [place] [address] [phone]  OR  [facility] [address] [place], [place] [address] [phone]  OR   [facility] [address] [place], [place] [address] [phone]  OR   [facility] [address] [place], [place] [address] [phone]  If scheduled at [facility], please arrive at the [facility] and Children's Entrance (Entrance C2) of [facility] 30 minutes prior to test start time. You can use the FREE valet parking offered at entrance C (encouraged to control the heart rate for the test)  Proceed to the [facility] Radiology Department (first floor) to check-in and test prep.  All radiology patients and guests should use entrance C2 at [facility], accessed from [facility], even though the hospital's physical address listed is [address].    If scheduled at [facility] or [facility], please arrive 15 mins early for check-in and test prep.  There is spacious parking and easy access to the radiology department from the [facility] Heart and Vascular entrance. Please enter here and check-in with the desk attendant.   If scheduled at [facility], please arrive 30 minutes early for check-in and test prep.  Please follow these instructions carefully (unless otherwise directed):  An IV will be required for this test and Nitroglycerin will be given.   On the Night Before the Test: Be sure to Drink plenty of water. Do not consume any caffeinated/decaffeinated beverages or chocolate 12 hours prior to your test. Do not take any antihistamines 12 hours prior to your test.  On the Day of the Test: Drink plenty of water until 1 hour prior to the test. Do not eat any food 1 hour prior to test. You may take your regular medications prior to the test.  Take metoprolol (Lopressor) two hours prior to test. If you take Furosemide/Hydrochlorothiazide/Spironolactone/Chlorthalidone, please HOLD on the morning of the test. Patients who wear a continuous glucose monitor MUST remove the device prior to scanning. FEMALES- please wear underwire-free bra if available, avoid dresses & tight clothing   After the Test: Drink plenty of  water. After receiving IV contrast, you may experience a mild flushed feeling. This is normal. On occasion, you may experience a mild rash up to 24 hours after the test. This is not dangerous. If this occurs, you can take Benadryl 25 mg, Zyrtec, Claritin, or Allegra and increase your fluid intake. (Patients taking Tikosyn should avoid Benadryl, and may take Zyrtec, Claritin, or Allegra) If you experience trouble breathing, this can be serious. If it is severe call 911 IMMEDIATELY. If it is mild, please call our office.  We will call to schedule your test 2-4 weeks out understanding that some insurance companies will need an authorization prior to the service being performed.   For more information and frequently asked questions, please visit our website : [email or web address]  For non-scheduling related questions, please contact the cardiac imaging nurse navigator should you have any questions/concerns: Cardiac Imaging Nurse Navigators Direct Office Dial: [phone]   For scheduling needs, including cancellations and rescheduling, please call [place], [phone].

## 2023-10-16 NOTE — Progress Notes (Signed)
 Cardiology Office Note:    Date:  10/16/2023   ID:  Jenna James, DOB 03-01-1971, MRN [id number]  PCP:  Larae Grooms, NP   [facility] Providers Cardiologist:  Debbe Odea, MD     Referring MD: Larae Grooms, NP   Chief Complaint  Patient presents with   New Patient (Initial Visit)    Patient was at [facility] ER with elevated troponin and HTN on 10/07/2023. Patient c/o chest pain when under a lot of stress.    Jenene Kauffmann is a 53 y.o. female who is being seen today for the evaluation of chest pain at the request of Larae Grooms, NP.   History of Present Illness:    Elinore Shults is a 53 y.o. female with a hx of hypertension who presents with chest pain.  Presented to the ED a week ago with symptoms of chest pain, BP noted to be elevated at 198/90.  Workup with echocardiogram, EKG was unrevealing.  Troponins were minimally elevated at 73, 66.  Was started on lisinopril and HCTZ, patient is tolerating.  Symptoms of chest pain are associated with stress.  Denies immediate family history of heart disease.  Grandfather on dad side had a heart attack in his early 56s.  Echo 09/2023 EF 60 to 65%.  Past Medical History:  Diagnosis Date   Anxiety    Dyspnea     Past Surgical History:  Procedure Laterality Date   ANTERIOR CRUCIATE LIGAMENT REPAIR Left    left knee   BREAST SURGERY     cyst removed   COLONOSCOPY  11/2019    Current Medications: Current Meds  Medication Sig   albuterol (VENTOLIN HFA) 108 (90 Base) MCG/ACT inhaler Inhale 2 puffs into the lungs every 6 (six) hours as needed for wheezing or shortness of breath.   Ascorbic Acid (VITAMIN C) 1000 MG tablet Take 1,000 mg by mouth daily.   aspirin EC 81 MG tablet Take 1 tablet (81 mg total) by mouth daily. Swallow whole.   Cholecalciferol (VITAMIN D3) 50 MCG (2000 UT) capsule Take 2,000 Units by mouth daily.   clonazePAM (KLONOPIN) 0.5 MG tablet TAKE 1 TABLET(0.5 MG) BY MOUTH DAILY AS NEEDED FOR  ANXIETY   hydrochlorothiazide (HYDRODIURIL) 12.5 MG tablet Take 1 tablet (12.5 mg total) by mouth daily.   levonorgestrel (MIRENA) 20 MCG/24HR IUD by Intrauterine route.   lisinopril (ZESTRIL) 20 MG tablet Take 1 tablet (20 mg total) by mouth daily.   metoprolol tartrate (LOPRESSOR) 25 MG tablet Take one tablet two hours before testing   triamcinolone cream (KENALOG) 0.1 % Apply 1 Application topically 2 (two) times daily.   valACYclovir (VALTREX) 1000 MG tablet TAKE 2 TABLET(1000 MG) BY MOUTH BID for 1 day prn sx   vitamin B-12 (CYANOCOBALAMIN) 100 MCG tablet Take 100 mcg by mouth daily.     Allergies:   Citalopram   Social History   Socioeconomic History   Marital status: Married    Spouse name: Not on file   Number of children: Not on file   Years of education: Not on file   Highest education level: Not on file  Occupational History   Not on file  Tobacco Use   Smoking status: Never   Smokeless tobacco: Never  Vaping Use   Vaping status: Never Used  Substance and Sexual Activity   Alcohol use: Yes    Alcohol/week: 1.0 - 2.0 standard drink of alcohol    Types: 1 - 2 Standard drinks or equivalent per week  Drug use: No   Sexual activity: Yes    Birth control/protection: I.U.D.    Comment: Mirena  Other Topics Concern   Not on file  Social History Narrative   Not on file   Social Drivers of Health   Financial Resource Strain: Not on file  Food Insecurity: No Food Insecurity (10/08/2023)   Hunger Vital Sign    Worried About Running Out of Food in the Last Year: Never true    Ran Out of Food in the Last Year: Never true  Transportation Needs: No Transportation Needs (10/08/2023)   PRAPARE - Administrator, Civil Service (Medical): No    Lack of Transportation (Non-Medical): No  Physical Activity: Not on file  Stress: Not on file  Social Connections: Not on file     Family History: The patient's family history includes Breast cancer in her paternal  grandmother; Colon cancer in her maternal aunt; Prostate cancer in her father.  ROS:   Please see the history of present illness.     All other systems reviewed and are negative.  EKGs/Labs/Other Studies Reviewed:    The following studies were reviewed today:  EKG Interpretation Date/Time:  Wednesday October 16 2023 10:09:12 EDT Ventricular Rate:  60 PR Interval:  158 QRS Duration:  76 QT Interval:  414 QTC Calculation: 414 R Axis:   12  Text Interpretation: Normal sinus rhythm Possible Left atrial enlargement Confirmed by Debbe Odea ([id number]) on 10/16/2023 10:14:43 AM    Recent Labs: 10/07/2023: Hemoglobin 12.2; Platelets 281; TSH 3.444 10/15/2023: ALT 15; BUN 13; Creatinine, Ser 0.78; Potassium 4.4; Sodium 138  Recent Lipid Panel    Component Value Date/Time   CHOL 182 10/07/2023 1109   CHOL 209 (H) 12/04/2022 1129   TRIG 124 10/07/2023 1109   HDL 55 10/07/2023 1109   HDL 60 12/04/2022 1129   CHOLHDL 3.3 10/07/2023 1109   VLDL 25 10/07/2023 1109   LDLCALC 102 (H) 10/07/2023 1109   LDLCALC 129 (H) 12/04/2022 1129     Risk Assessment/Calculations:             Physical Exam:    VS:  BP 120/70 (BP Location: Right Arm, Patient Position: Sitting, Cuff Size: Normal)   Pulse 60   Ht 5' 4.5" (1.638 m)   Wt 172 lb 2 oz (78.1 kg)   SpO2 98%   BMI 29.09 kg/m     Wt Readings from Last 3 Encounters:  10/16/23 172 lb 2 oz (78.1 kg)  10/15/23 171 lb 3.2 oz (77.7 kg)  10/07/23 158 lb 1.1 oz (71.7 kg)     GEN:  Well nourished, well developed in no acute distress HEENT: Normal NECK: No JVD; No carotid bruits CARDIAC: RRR, no murmurs, rubs, gallops RESPIRATORY:  Clear to auscultation without rales, wheezing or rhonchi  ABDOMEN: Soft, non-tender, non-distended MUSCULOSKELETAL:  No edema; No deformity  SKIN: Warm and dry NEUROLOGIC:  Alert and oriented x 3 PSYCHIATRIC:  Normal affect   ASSESSMENT:    1. Primary hypertension   2. Precordial pain   3. Pure  hypercholesterolemia    PLAN:    In order of problems listed above:  Hypertension, BP controlled, continue lisinopril 20 mg daily, HCTZ 12.5 mg daily. Chest pain, echo with normal EF.  Obtain coronary CTA. Hyperlipidemia, 10-year ASCVD risk 2.1%.  Not in statin benefit group.  Continue low-cholesterol diet.     Follow-up after coronary CT   Medication Adjustments/Labs and Tests Ordered: Current medicines are reviewed  at length with the patient today.  Concerns regarding medicines are outlined above.  Orders Placed This Encounter  Procedures   CT CORONARY MORPH W/CTA COR W/SCORE W/CA W/CM &/OR WO/CM   EKG 12-Lead   Meds ordered this encounter  Medications   metoprolol tartrate (LOPRESSOR) 25 MG tablet    Sig: Take one tablet two hours before testing    Dispense:  1 tablet    Refill:  0    Patient Instructions  Medication Instructions:  No changes *If you need a refill on your cardiac medications before your next appointment, please call your pharmacy*   Lab Work: None ordered If you have labs (blood work) drawn today and your tests are completely normal, you will receive your results only by: MyChart Message (if you have MyChart) OR A paper copy in the mail If you have any lab test that is abnormal or we need to change your treatment, we will call you to review the results.   Follow-Up: At [facility], you and your health needs are our priority.  As part of our continuing mission to provide you with exceptional heart care, we have created designated Provider Care Teams.  These Care Teams include your primary Cardiologist (physician) and Advanced Practice Providers (APPs -  Physician Assistants and Nurse Practitioners) who all work together to provide you with the care you need, when you need it.  We recommend signing up for the patient portal called "MyChart".  Sign up information is provided on this After Visit Summary.  MyChart is used to connect with patients  for Virtual Visits (Telemedicine).  Patients are able to view lab/test results, encounter notes, upcoming appointments, etc.  Non-urgent messages can be sent to your provider as well.   To learn more about what you can do with MyChart, go to [email or web address].    Your next appointment:   Follow up with Dr. Azucena Cecil or app after testing  Other Instructions   Your cardiac CT will be scheduled at one of the below locations:   [facility] [address] [place], [place] [address] [phone]  OR  [facility] [address] [place], [place] [address] [phone]  OR   [facility] [address] [place], [place] [address] [phone]  OR   [facility] [address] [place], [place] [address] [phone]  If scheduled at [facility], please arrive at the [facility] Entrance (Entrance C2) of [facility] 30 minutes prior to test start time. You can use the FREE valet parking offered at entrance C (encouraged to control the heart rate for the test)  Proceed to the [facility] Radiology Department (first floor) to check-in and test prep.  All radiology patients and guests should use entrance C2 at [facility], accessed from [facility], even though the hospital's physical address listed is [address]    If scheduled at [facility] or [facility], please arrive 15 mins early for check-in and test prep.  There is spacious parking and easy access to the radiology department from the [facility] V[facility] entrance. Please enter here and check-in with the desk attendant.   If scheduled at [facility], please arrive 30 minutes early for check-in and test prep.  Please follow these instructions carefully (unless otherwise directed):  An IV will be required  for this test and  Nitroglycerin will be given.   On the Night Before the Test: Be sure to Drink plenty of water. Do not consume any caffeinated/decaffeinated beverages or chocolate 12 hours prior to your test. Do not take any antihistamines 12 hours prior to your test.  On the Day of the Test: Drink plenty of water until 1 hour prior to the test. Do not eat any food 1 hour prior to test. You may take your regular medications prior to the test.  Take metoprolol (Lopressor) two hours prior to test. If you take Furosemide/Hydrochlorothiazide/Spironolactone/Chlorthalidone, please HOLD on the morning of the test. Patients who wear a continuous glucose monitor MUST remove the device prior to scanning. FEMALES- please wear underwire-free bra if available, avoid dresses & tight clothing   After the Test: Drink plenty of water. After receiving IV contrast, you may experience a mild flushed feeling. This is normal. On occasion, you may experience a mild rash up to 24 hours after the test. This is not dangerous. If this occurs, you can take Benadryl 25 mg, Zyrtec, Claritin, or Allegra and increase your fluid intake. (Patients taking Tikosyn should avoid Benadryl, and may take Zyrtec, Claritin, or Allegra) If you experience trouble breathing, this can be serious. If it is severe call 911 IMMEDIATELY. If it is mild, please call our office.  We will call to schedule your test 2-4 weeks out understanding that some insurance companies will need an authorization prior to the service being performed.   For more information and frequently asked questions, please visit our website : [email or web address]  For non-scheduling related questions, please contact the cardiac imaging nurse navigator should you have any questions/concerns: Cardiac Imaging Nurse Navigators Direct Office Dial: [phone]   For scheduling needs, including cancellations and rescheduling, please call [place],  [phone].        Signed, Debbe Odea, MD  10/16/2023 11:32 AM     [facility]

## 2023-11-01 MED ORDER — LISINOPRIL 20 MG PO TABS: 20.0000 mg | ORAL_TABLET | Freq: Every day | ORAL | 0 refills | Status: DC

## 2023-11-06 ENCOUNTER — Telehealth (HOSPITAL_COMMUNITY): Payer: Self-pay | Admitting: *Deleted

## 2023-11-06 NOTE — Telephone Encounter (Signed)
 Reaching out to patient to offer assistance regarding upcoming cardiac imaging study; pt verbalizes understanding of appt date/time, parking situation and where to check in, pre-test NPO status and medications ordered, and verified current allergies; name and call back number provided for further questions should they arise Johney Frame RN Navigator Cardiac Imaging [facility] [phone] office [phone] cell

## 2023-11-06 NOTE — Telephone Encounter (Signed)
 Attempted to call patient regarding upcoming cardiac CT appointment. Left message on voicemail with name and callback number Johney Frame RN Navigator Cardiac Imaging [facility] [phone] Office

## 2023-11-07 ENCOUNTER — Ambulatory Visit
Admission: RE | Admit: 2023-11-07 | Discharge: 2023-11-07 | Disposition: A | Discharge status: Home / Self Care | Source: Ambulatory Visit | Attending: Cardiology | Admitting: Cardiology

## 2023-11-07 DIAGNOSIS — R072 Precordial pain: Secondary | ICD-10-CM | POA: Insufficient documentation

## 2023-11-07 MED ORDER — IOHEXOL 350 MG/ML SOLN
75.0000 mL | Freq: Once | INTRAVENOUS | Status: AC | PRN
  Administered 2023-11-07: 75 mL via INTRAVENOUS

## 2023-11-07 MED ORDER — SODIUM CHLORIDE 0.9 % IV SOLN: INTRAVENOUS | Status: DC

## 2023-11-07 MED ORDER — NITROGLYCERIN 0.4 MG SL SUBL
0.8000 mg | SUBLINGUAL_TABLET | Freq: Once | SUBLINGUAL | Status: AC
  Administered 2023-11-07: 0.8 mg via SUBLINGUAL

## 2023-11-07 NOTE — Progress Notes (Signed)
 Patient tolerated procedure well. Ambulate w/o difficulty. Denies light headedness or being dizzy. Sitting up drinking water provided. Encouraged to drink extra water today and reasoning explained. Verbalized understanding. All questions answered. ABC intact. No further needs. Discharge from procedure area w/o issues.

## 2023-11-11 ENCOUNTER — Encounter: Payer: Self-pay | Admitting: Cardiology

## 2023-11-14 ENCOUNTER — Ambulatory Visit: Admission: RE | Admit: 2023-11-14 | Source: Ambulatory Visit

## 2023-11-19 DIAGNOSIS — G4730 Sleep apnea, unspecified: Secondary | ICD-10-CM | POA: Diagnosis not present

## 2023-12-09 ENCOUNTER — Encounter: Payer: Self-pay | Admitting: Nurse Practitioner

## 2023-12-09 ENCOUNTER — Ambulatory Visit (INDEPENDENT_AMBULATORY_CARE_PROVIDER_SITE_OTHER): Admitting: Nurse Practitioner

## 2023-12-09 VITALS — BP 127/85 | HR 77 | Temp 98.5°F | Ht 65.0 in | Wt 172.6 lb

## 2023-12-09 DIAGNOSIS — Z0000 Encounter for general adult medical examination without abnormal findings: Secondary | ICD-10-CM

## 2023-12-09 DIAGNOSIS — Z124 Encounter for screening for malignant neoplasm of cervix: Secondary | ICD-10-CM | POA: Diagnosis not present

## 2023-12-09 DIAGNOSIS — I10 Essential (primary) hypertension: Secondary | ICD-10-CM

## 2023-12-09 DIAGNOSIS — E781 Pure hyperglyceridemia: Secondary | ICD-10-CM

## 2023-12-09 MED ORDER — LISINOPRIL 20 MG PO TABS: 20.0000 mg | ORAL_TABLET | Freq: Every day | ORAL | 1 refills | Status: DC

## 2023-12-09 MED ORDER — HYDROCHLOROTHIAZIDE 12.5 MG PO TABS: 12.5000 mg | ORAL_TABLET | Freq: Every day | ORAL | 1 refills | Status: DC

## 2023-12-09 NOTE — Progress Notes (Unsigned)
 PCP: Aileen Alexanders, NP   No chief complaint on file.   HPI:      Jenna James is a 53 y.o. [id number] whose LMP was No LMP recorded. (Menstrual status: IUD)., presents today for her annual examination.  Her menses are monthly with IUD, lasting a few days, light flow, no BTB, mild dysmen. Used to be amenorrheic.  Has had some occas sharp pains RLQ vs LLQ. Can't say when started, how frequently she gets them. Don't last long. Is having RLQ pains today. Has some constipation at times, unsure if related. Also with some RT hip discomfort.   Sex activity: single partner, contraception - IUD. Mirena  REplaced 03/18/19 She uses lubricants prn, no pain/bleeding.  Last Pap: 12/04/22  Results were: ASCUS/neg HPV DNA. No hx of abn paps.  Last mammogram: 02/14/23  Results were: normal--routine follow-up in 12 months There is a FH of breast cancer in her PGM, genetic testing not indicated. There is no FH of ovarian cancer. There is a FH of non-metastatic prostate cancer in her dad. The patient does not do self-breast exams.  Colonoscopy: 2021 with Dr. Melissa Rich at [facility]; Repeat due after 7-10 years. There is a FH of colon cancer in her mat aunt.  Tobacco use: The patient denies current or previous tobacco use. Alcohol use: social drinker  No drug use Exercise: very active  She does get adequate calcium but not Vitamin D in her diet. Labs with PCP  Past Medical History:  Diagnosis Date   Anxiety    Dyspnea     Past Surgical History:  Procedure Laterality Date   ANTERIOR CRUCIATE LIGAMENT REPAIR Left    left knee   BREAST SURGERY     cyst removed   COLONOSCOPY  11/2019    Family History  Problem Relation Age of Onset   Prostate cancer Father    Colon cancer Maternal Aunt    Breast cancer Paternal Grandmother        14s?    Social History   Socioeconomic History   Marital status: Married    Spouse name: Not on file   Number of children: Not on file   Years of education:  Not on file   Highest education level: Not on file  Occupational History   Not on file  Tobacco Use   Smoking status: Never   Smokeless tobacco: Never  Vaping Use   Vaping status: Never Used  Substance and Sexual Activity   Alcohol use: Yes    Alcohol/week: 1.0 - 2.0 standard drink of alcohol    Types: 1 - 2 Standard drinks or equivalent per week   Drug use: No   Sexual activity: Yes    Birth control/protection: I.U.D.    Comment: Mirena   Other Topics Concern   Not on file  Social History Narrative   Not on file   Social Drivers of Health   Financial Resource Strain: Not on file  Food Insecurity: No Food Insecurity (10/08/2023)   Hunger Vital Sign    Worried About Running Out of Food in the Last Year: Never true    Ran Out of Food in the Last Year: Never true  Transportation Needs: No Transportation Needs (10/08/2023)   PRAPARE - Administrator, Civil Service (Medical): No    Lack of Transportation (Non-Medical): No  Physical Activity: Not on file  Stress: Not on file  Social Connections: Not on file  Intimate Partner Violence: Not At Risk (10/08/2023)  Humiliation, Afraid, Rape, and Kick questionnaire    Fear of Current or Ex-Partner: No    Emotionally Abused: No    Physically Abused: No    Sexually Abused: No     Current Outpatient Medications:    albuterol  (VENTOLIN  HFA) 108 (90 Base) MCG/ACT inhaler, Inhale 2 puffs into the lungs every 6 (six) hours as needed for wheezing or shortness of breath., Disp: 18 g, Rfl: 2   Ascorbic Acid (VITAMIN C) 1000 MG tablet, Take 1,000 mg by mouth daily., Disp: , Rfl:    aspirin  EC 81 MG tablet, Take 1 tablet (81 mg total) by mouth daily. Swallow whole., Disp: 30 tablet, Rfl: 12   Cholecalciferol (VITAMIN D3) 50 MCG (2000 UT) capsule, Take 2,000 Units by mouth daily., Disp: , Rfl:    clonazePAM  (KLONOPIN ) 0.5 MG tablet, TAKE 1 TABLET(0.5 MG) BY MOUTH DAILY AS NEEDED FOR ANXIETY, Disp: 30 tablet, Rfl: 0    hydrochlorothiazide  (HYDRODIURIL ) 12.5 MG tablet, Take 1 tablet (12.5 mg total) by mouth daily., Disp: 90 tablet, Rfl: 1   levonorgestrel  (MIRENA ) 20 MCG/24HR IUD, by Intrauterine route., Disp: , Rfl:    lisinopril  (ZESTRIL ) 20 MG tablet, Take 1 tablet (20 mg total) by mouth daily., Disp: 90 tablet, Rfl: 1   triamcinolone  cream (KENALOG ) 0.1 %, Apply 1 Application topically 2 (two) times daily., Disp: , Rfl:    valACYclovir  (VALTREX ) 1000 MG tablet, TAKE 2 TABLET(1000 MG) BY MOUTH BID for 1 day prn sx, Disp: 30 tablet, Rfl: 0   vitamin B-12 (CYANOCOBALAMIN) 100 MCG tablet, Take 100 mcg by mouth daily., Disp: , Rfl:      ROS:  Review of Systems  Constitutional:  Negative for fatigue, fever and unexpected weight change.  Respiratory:  Negative for cough, shortness of breath and wheezing.   Cardiovascular:  Negative for chest pain, palpitations and leg swelling.  Gastrointestinal:  Negative for blood in stool, constipation, diarrhea, nausea and vomiting.  Endocrine: Negative for cold intolerance, heat intolerance and polyuria.  Genitourinary:  Negative for dyspareunia, dysuria, flank pain, frequency, genital sores, hematuria, menstrual problem, pelvic pain, urgency, vaginal bleeding, vaginal discharge and vaginal pain.  Musculoskeletal:  Negative for back pain, joint swelling and myalgias.  Skin:  Negative for rash.  Neurological:  Negative for dizziness, syncope, light-headedness, numbness and headaches.  Hematological:  Negative for adenopathy.  Psychiatric/Behavioral:  Negative for agitation, confusion, sleep disturbance and suicidal ideas. The patient is not nervous/anxious.   BREAST: No symptoms    Objective: There were no vitals taken for this visit.   Physical Exam Constitutional:      Appearance: She is well-developed.  Genitourinary:     Vulva normal.     Genitourinary Comments: MILD RECTOCELE ON VALSALVA     Right Labia: No rash, tenderness or lesions.    Left Labia: No  tenderness, lesions or rash.    No vaginal discharge, erythema or tenderness.      Right Adnexa: tender.    Right Adnexa: no mass present.    Left Adnexa: not tender and no mass present.    No cervical friability or polyp.     IUD strings visualized.     Uterus is not enlarged or tender.  Breasts:    Right: No mass, nipple discharge, skin change or tenderness.     Left: No mass, nipple discharge, skin change or tenderness.  Neck:     Thyroid : No thyromegaly.  Cardiovascular:     Rate and Rhythm: Normal rate and regular  rhythm.     Heart sounds: Normal heart sounds. No murmur heard. Pulmonary:     Effort: Pulmonary effort is normal.     Breath sounds: Normal breath sounds.  Abdominal:     Palpations: Abdomen is soft.     Tenderness: There is no abdominal tenderness. There is no guarding or rebound.  Musculoskeletal:        General: Normal range of motion.     Cervical back: Normal range of motion.  Lymphadenopathy:     Cervical: No cervical adenopathy.  Neurological:     General: No focal deficit present.     Mental Status: She is alert and oriented to person, place, and time.     Cranial Nerves: No cranial nerve deficit.  Skin:    General: Skin is warm and dry.  Psychiatric:        Mood and Affect: Mood normal.        Behavior: Behavior normal.        Thought Content: Thought content normal.        Judgment: Judgment normal.  Vitals reviewed.     Assessment/Plan: Encounter for annual routine gynecological examination  Cervical cancer screening - Plan: Cytology - PAP  Screening for HPV (human papillomavirus) - Plan: Cytology - PAP  Encounter for routine checking of intrauterine contraceptive device (IUD)--IUD strings in cx os. Has 8 yr indication. Having monthly bleeding for past 2 yrs. Most likely not related to hormone drop after 4 yrs with IUD. F/u prn. Can replace early if sx worsen.  Encounter for screening mammogram for malignant neoplasm of breast  Blood  tests for routine general physical examination - Plan: Comprehensive metabolic panel, Lipid panel  Screening cholesterol level - Plan: Lipid panel  Cold sore--doesn't need Valtrex  RF currently.  Pelvic pain--pt can't give much detail. Slightly tender RLQ. Having constipation issues. Increase fiber/water. Will check Gyn u/s if sx persist. Pt to try to get more details about sx.           GYN counsel adequate intake of calcium and vitamin D, diet and exercise    F/U  No follow-ups on file.  Jenna James B. Marlinda Miranda, PA-C 12/09/2023 5:17 PM

## 2023-12-09 NOTE — Assessment & Plan Note (Signed)
Chronic.  Controlled.  Continue with current medication regimen of Lisinopril and HCTZ.  Labs ordered today.  Return to clinic in 6 months for reevaluation.  Call sooner if concerns arise.   

## 2023-12-09 NOTE — Assessment & Plan Note (Signed)
 Labs ordered at visit today.  Will make recommendations based on lab results.

## 2023-12-09 NOTE — Progress Notes (Signed)
 BP 127/85   Pulse 77   Temp 98.5 F (36.9 C) (Oral)   Ht 5\' 5"  (1.651 m)   Wt 172 lb 9.6 oz (78.3 kg)   SpO2 99%   BMI 28.72 kg/m    Subjective:    Patient ID: Jenna James, female    DOB: 18-Nov-1970, 53 y.o.   MRN: [id number]  HPI: Jenna James is a 53 y.o. female presenting on 12/09/2023 for comprehensive medical examination. Current medical complaints include:none  She currently lives with: Menopausal Symptoms: no  HYPERTENSION / HYPERLIPIDEMIA Satisfied with current treatment? yes Duration of hypertension: years BP monitoring frequency: weekly BP range:  BP medication side effects: no Past BP meds: metoprolol , HCTZ, and lisinopril  Duration of hyperlipidemia: years Cholesterol medication side effects: no Cholesterol supplements: none Past cholesterol medications: none Medication compliance: excellent compliance Aspirin : no Recent stressors: no Recurrent headaches: no Visual changes: no Palpitations: no Dyspnea: no Chest pain: no Lower extremity edema: no Dizzy/lightheaded: no   Depression Screen done today and results listed below:     12/09/2023   10:56 AM 11/22/2021    3:11 PM 11/04/2019    3:36 PM 02/11/2018    2:17 PM  Depression screen PHQ 2/9  Decreased Interest 0 0 0 0  Down, Depressed, Hopeless 0 0 0 0  PHQ - 2 Score 0 0 0 0  Altered sleeping 0 0 1 0  Tired, decreased energy 0 0 1 0  Change in appetite 0 0 0 0  Feeling bad or failure about yourself  0 0 0 0  Trouble concentrating 0 0 0 0  Moving slowly or fidgety/restless 0 0 0 0  Suicidal thoughts 0 0 0 0  PHQ-9 Score 0 0 2 0  Difficult doing work/chores Not difficult at all Not difficult at all      The patient does not have a history of falls. I did complete a risk assessment for falls. A plan of care for falls was documented.   Past Medical History:  Past Medical History:  Diagnosis Date   Anxiety    Dyspnea     Surgical History:  Past Surgical History:  Procedure Laterality Date    ANTERIOR CRUCIATE LIGAMENT REPAIR Left    left knee   BREAST SURGERY     cyst removed   COLONOSCOPY  11/2019    Medications:  Current Outpatient Medications on File Prior to Visit  Medication Sig   albuterol  (VENTOLIN  HFA) 108 (90 Base) MCG/ACT inhaler Inhale 2 puffs into the lungs every 6 (six) hours as needed for wheezing or shortness of breath.   Ascorbic Acid (VITAMIN C) 1000 MG tablet Take 1,000 mg by mouth daily.   aspirin  EC 81 MG tablet Take 1 tablet (81 mg total) by mouth daily. Swallow whole.   Cholecalciferol (VITAMIN D3) 50 MCG (2000 UT) capsule Take 2,000 Units by mouth daily.   clonazePAM  (KLONOPIN ) 0.5 MG tablet TAKE 1 TABLET(0.5 MG) BY MOUTH DAILY AS NEEDED FOR ANXIETY   levonorgestrel  (MIRENA ) 20 MCG/24HR IUD by Intrauterine route.   triamcinolone  cream (KENALOG ) 0.1 % Apply 1 Application topically 2 (two) times daily.   valACYclovir  (VALTREX ) 1000 MG tablet TAKE 2 TABLET(1000 MG) BY MOUTH BID for 1 day prn sx   vitamin B-12 (CYANOCOBALAMIN) 100 MCG tablet Take 100 mcg by mouth daily.   No current facility-administered medications on file prior to visit.    Allergies:  Allergies  Allergen Reactions   Citalopram Other (See Comments)    Social  History:  Social History   Socioeconomic History   Marital status: Married    Spouse name: Not on file   Number of children: Not on file   Years of education: Not on file   Highest education level: Not on file  Occupational History   Not on file  Tobacco Use   Smoking status: Never   Smokeless tobacco: Never  Vaping Use   Vaping status: Never Used  Substance and Sexual Activity   Alcohol use: Yes    Alcohol/week: 1.0 - 2.0 standard drink of alcohol    Types: 1 - 2 Standard drinks or equivalent per week   Drug use: No   Sexual activity: Yes    Birth control/protection: I.U.D.    Comment: Mirena   Other Topics Concern   Not on file  Social History Narrative   Not on file   Social Drivers of Health    Financial Resource Strain: Not on file  Food Insecurity: No Food Insecurity (10/08/2023)   Hunger Vital Sign    Worried About Running Out of Food in the Last Year: Never true    Ran Out of Food in the Last Year: Never true  Transportation Needs: No Transportation Needs (10/08/2023)   PRAPARE - Administrator, Civil Service (Medical): No    Lack of Transportation (Non-Medical): No  Physical Activity: Not on file  Stress: Not on file  Social Connections: Not on file  Intimate Partner Violence: Not At Risk (10/08/2023)   Humiliation, Afraid, Rape, and Kick questionnaire    Fear of Current or Ex-Partner: No    Emotionally Abused: No    Physically Abused: No    Sexually Abused: No   Social History   Tobacco Use  Smoking Status Never  Smokeless Tobacco Never   Social History   Substance and Sexual Activity  Alcohol Use Yes   Alcohol/week: 1.0 - 2.0 standard drink of alcohol   Types: 1 - 2 Standard drinks or equivalent per week    Family History:  Family History  Problem Relation Age of Onset   Prostate cancer Father    Colon cancer Maternal Aunt    Breast cancer Paternal Grandmother        33s?    Past medical history, surgical history, medications, allergies, family history and social history reviewed with patient today and changes made to appropriate areas of the chart.   Review of Systems  Eyes:  Negative for blurred vision and double vision.  Respiratory:  Negative for shortness of breath.   Cardiovascular:  Negative for chest pain, palpitations and leg swelling.  Neurological:  Negative for dizziness and headaches.   All other ROS negative except what is listed above and in the HPI.      Objective:     BP 127/85   Pulse 77   Temp 98.5 F (36.9 C) (Oral)   Ht 5\' 5"  (1.651 m)   Wt 172 lb 9.6 oz (78.3 kg)   SpO2 99%   BMI 28.72 kg/m   Wt Readings from Last 3 Encounters:  12/09/23 172 lb 9.6 oz (78.3 kg)  10/16/23 172 lb 2 oz (78.1 kg)   10/15/23 171 lb 3.2 oz (77.7 kg)    Physical Exam Vitals and nursing note reviewed.  Constitutional:      General: She is awake. She is not in acute distress.    Appearance: Normal appearance. She is well-developed. She is not ill-appearing.  HENT:     Head: Normocephalic  and atraumatic.     Right Ear: Hearing, tympanic membrane, ear canal and external ear normal. No drainage.     Left Ear: Hearing, tympanic membrane, ear canal and external ear normal. No drainage.     Nose: Nose normal.     Right Sinus: No maxillary sinus tenderness or frontal sinus tenderness.     Left Sinus: No maxillary sinus tenderness or frontal sinus tenderness.     Mouth/Throat:     Mouth: Mucous membranes are moist.     Pharynx: Oropharynx is clear. Uvula midline. No pharyngeal swelling, oropharyngeal exudate or posterior oropharyngeal erythema.  Eyes:     General: Lids are normal.        Right eye: No discharge.        Left eye: No discharge.     Extraocular Movements: Extraocular movements intact.     Conjunctiva/sclera: Conjunctivae normal.     Pupils: Pupils are equal, round, and reactive to light.     Visual Fields: Right eye visual fields normal and left eye visual fields normal.  Neck:     Thyroid : No thyromegaly.     Vascular: No carotid bruit.     Trachea: Trachea normal.  Cardiovascular:     Rate and Rhythm: Normal rate and regular rhythm.     Heart sounds: Normal heart sounds. No murmur heard.    No gallop.  Pulmonary:     Effort: Pulmonary effort is normal. No accessory muscle usage or respiratory distress.     Breath sounds: Normal breath sounds.  Chest:  Breasts:    Right: Normal.     Left: Normal.  Abdominal:     General: Bowel sounds are normal.     Palpations: Abdomen is soft. There is no hepatomegaly or splenomegaly.     Tenderness: There is no abdominal tenderness.  Musculoskeletal:        General: Normal range of motion.     Cervical back: Normal range of motion and neck  supple.     Right lower leg: No edema.     Left lower leg: No edema.  Lymphadenopathy:     Head:     Right side of head: No submental, submandibular, tonsillar, preauricular or posterior auricular adenopathy.     Left side of head: No submental, submandibular, tonsillar, preauricular or posterior auricular adenopathy.     Cervical: No cervical adenopathy.     Upper Body:     Right upper body: No supraclavicular, axillary or pectoral adenopathy.     Left upper body: No supraclavicular, axillary or pectoral adenopathy.  Skin:    General: Skin is warm and dry.     Capillary Refill: Capillary refill takes less than 2 seconds.     Findings: No rash.  Neurological:     Mental Status: She is alert and oriented to person, place, and time.     Gait: Gait is intact.  Psychiatric:        Attention and Perception: Attention normal.        Mood and Affect: Mood normal.        Speech: Speech normal.        Behavior: Behavior normal. Behavior is cooperative.        Thought Content: Thought content normal.        Judgment: Judgment normal.     Results for orders placed or performed in visit on 10/15/23  Comp Met (CMET)   Collection Time: 10/15/23  2:54 PM  Result Value Ref Range  Glucose 84 70 - 99 mg/dL   BUN 13 6 - 24 mg/dL   Creatinine, Ser 4.09 0.57 - 1.00 mg/dL   eGFR 90 >81 XB/JYN/8.29   BUN/Creatinine Ratio 17 9 - 23   Sodium 138 134 - 144 mmol/L   Potassium 4.4 3.5 - 5.2 mmol/L   Chloride 101 96 - 106 mmol/L   CO2 23 20 - 29 mmol/L   Calcium 9.9 8.7 - 10.2 mg/dL   Total Protein 6.8 6.0 - 8.5 g/dL   Albumin 4.7 3.8 - 4.9 g/dL   Globulin, Total 2.1 1.5 - 4.5 g/dL   Bilirubin Total 0.5 0.0 - 1.2 mg/dL   Alkaline Phosphatase 77 44 - 121 IU/L   AST 17 0 - 40 IU/L   ALT 15 0 - 32 IU/L      Assessment & Plan:   Problem List Items Addressed This Visit       Cardiovascular and Mediastinum   Primary hypertension   Chronic.  Controlled.  Continue with current medication  regimen of Lisinopril  and HCTZ.  Labs ordered today.  Return to clinic in 6 months for reevaluation.  Call sooner if concerns arise.        Relevant Medications   lisinopril  (ZESTRIL ) 20 MG tablet   hydrochlorothiazide  (HYDRODIURIL ) 12.5 MG tablet     Other   Hypertriglyceridemia   Labs ordered at visit today.  Will make recommendations based on lab results.        Relevant Medications   lisinopril  (ZESTRIL ) 20 MG tablet   hydrochlorothiazide  (HYDRODIURIL ) 12.5 MG tablet   Other Relevant Orders   Lipid panel   Other Visit Diagnoses       Annual physical exam    -  Primary   Health maintenance reviewed during visit today.  Labs ordered.  Vaccines reviewed.  PAP done at GYN.  Mammogram and Colonoscopy up to date.   Relevant Orders   CBC with Differential/Platelet   Comprehensive metabolic panel with GFR   Lipid panel   TSH     Screening for cervical cancer            Follow up plan: Return in about 6 months (around 06/10/2024) for HTN, HLD, DM2 FU.   LABORATORY TESTING:  - Pap smear: Will get done at GYN  IMMUNIZATIONS:   - Tdap: Tetanus vaccination status reviewed: Refused. - Influenza: Postponed to flu season - Pneumovax: Not applicable - Prevnar: Not applicable - COVID: Not applicable - HPV: Not applicable - Shingrix vaccine: Refused  SCREENING: -Mammogram: Up to date  - Colonoscopy: Up to date  - Bone Density: Not applicable  -Hearing Test: Not applicable  -Spirometry: Not applicable   PATIENT COUNSELING:   Advised to take 1 mg of folate supplement per day if capable of pregnancy.   Sexuality: Discussed sexually transmitted diseases, partner selection, use of condoms, avoidance of unintended pregnancy  and contraceptive alternatives.   Advised to avoid cigarette smoking.  I discussed with the patient that most people either abstain from alcohol or drink within safe limits (<=14/week and <=4 drinks/occasion for males, <=7/weeks and <= 3 drinks/occasion  for females) and that the risk for alcohol disorders and other health effects rises proportionally with the number of drinks per week and how often a drinker exceeds daily limits.  Discussed cessation/primary prevention of drug use and availability of treatment for abuse.   Diet: Encouraged to adjust caloric intake to maintain  or achieve ideal body weight, to reduce intake of dietary  saturated fat and total fat, to limit sodium intake by avoiding high sodium foods and not adding table salt, and to maintain adequate dietary potassium and calcium preferably from fresh fruits, vegetables, and low-fat dairy products.    stressed the importance of regular exercise  Injury prevention: Discussed safety belts, safety helmets, smoke detector, smoking near bedding or upholstery.   Dental health: Discussed importance of regular tooth brushing, flossing, and dental visits.    NEXT PREVENTATIVE PHYSICAL DUE IN 1 YEAR. Return in about 6 months (around 06/10/2024) for HTN, HLD, DM2 FU.

## 2023-12-10 ENCOUNTER — Other Ambulatory Visit (HOSPITAL_COMMUNITY)
Admission: RE | Admit: 2023-12-10 | Discharge: 2023-12-10 | Disposition: A | Discharge status: Home / Self Care | Source: Ambulatory Visit | Attending: Obstetrics and Gynecology | Admitting: Obstetrics and Gynecology

## 2023-12-10 ENCOUNTER — Encounter: Payer: Self-pay | Admitting: Obstetrics and Gynecology

## 2023-12-10 ENCOUNTER — Ambulatory Visit: Payer: Self-pay | Admitting: Nurse Practitioner

## 2023-12-10 ENCOUNTER — Ambulatory Visit (INDEPENDENT_AMBULATORY_CARE_PROVIDER_SITE_OTHER): Admitting: Obstetrics and Gynecology

## 2023-12-10 VITALS — BP 124/88 | Ht 65.0 in | Wt 175.0 lb

## 2023-12-10 DIAGNOSIS — Z01419 Encounter for gynecological examination (general) (routine) without abnormal findings: Secondary | ICD-10-CM | POA: Diagnosis not present

## 2023-12-10 DIAGNOSIS — Z124 Encounter for screening for malignant neoplasm of cervix: Secondary | ICD-10-CM | POA: Diagnosis not present

## 2023-12-10 DIAGNOSIS — Z1151 Encounter for screening for human papillomavirus (HPV): Secondary | ICD-10-CM | POA: Diagnosis not present

## 2023-12-10 DIAGNOSIS — R87610 Atypical squamous cells of undetermined significance on cytologic smear of cervix (ASC-US): Secondary | ICD-10-CM | POA: Insufficient documentation

## 2023-12-10 DIAGNOSIS — B001 Herpesviral vesicular dermatitis: Secondary | ICD-10-CM

## 2023-12-10 DIAGNOSIS — Z30431 Encounter for routine checking of intrauterine contraceptive device: Secondary | ICD-10-CM

## 2023-12-10 DIAGNOSIS — F419 Anxiety disorder, unspecified: Secondary | ICD-10-CM

## 2023-12-10 DIAGNOSIS — Z1231 Encounter for screening mammogram for malignant neoplasm of breast: Secondary | ICD-10-CM

## 2023-12-10 LAB — COMPREHENSIVE METABOLIC PANEL WITH GFR
ALT: 20 IU/L (ref 0–32)
AST: 17 IU/L (ref 0–40)
Albumin: 4.5 g/dL (ref 3.8–4.9)
Alkaline Phosphatase: 71 IU/L (ref 44–121)
BUN/Creatinine Ratio: 14 (ref 9–23)
BUN: 12 mg/dL (ref 6–24)
Bilirubin Total: 0.4 mg/dL (ref 0.0–1.2)
CO2: 21 mmol/L (ref 20–29)
Calcium: 9.6 mg/dL (ref 8.7–10.2)
Chloride: 103 mmol/L (ref 96–106)
Creatinine, Ser: 0.83 mg/dL (ref 0.57–1.00)
Globulin, Total: 2 g/dL (ref 1.5–4.5)
Glucose: 96 mg/dL (ref 70–99)
Potassium: 4.7 mmol/L (ref 3.5–5.2)
Sodium: 139 mmol/L (ref 134–144)
Total Protein: 6.5 g/dL (ref 6.0–8.5)
eGFR: 83 mL/min/{1.73_m2} (ref >59)

## 2023-12-10 LAB — CBC WITH DIFFERENTIAL/PLATELET
Basophils Absolute: 0 10*3/uL (ref 0.0–0.2)
Basos: 1 %
EOS (ABSOLUTE): 0.2 10*3/uL (ref 0.0–0.4)
Eos: 2 %
Hematocrit: 36.2 % (ref 34.0–46.6)
Hemoglobin: 12 g/dL (ref 11.1–15.9)
Immature Grans (Abs): 0 10*3/uL (ref 0.0–0.1)
Immature Granulocytes: 0 %
Lymphocytes Absolute: 1.7 10*3/uL (ref 0.7–3.1)
Lymphs: 24 %
MCH: 30.5 pg (ref 26.6–33.0)
MCHC: 33.1 g/dL (ref 31.5–35.7)
MCV: 92 fL (ref 79–97)
Monocytes Absolute: 0.4 10*3/uL (ref 0.1–0.9)
Monocytes: 5 %
Neutrophils Absolute: 4.8 10*3/uL (ref 1.4–7.0)
Neutrophils: 68 %
Platelets: 335 10*3/uL (ref 150–450)
RBC: 3.93 x10E6/uL (ref 3.77–5.28)
RDW: 13.2 % (ref 11.7–15.4)
WBC: 7.1 10*3/uL (ref 3.4–10.8)

## 2023-12-10 LAB — LIPID PANEL
Chol/HDL Ratio: 4.1 ratio (ref 0.0–4.4)
Cholesterol, Total: 217 mg/dL — ABNORMAL HIGH (ref 100–199)
HDL: 53 mg/dL (ref >39)
LDL Chol Calc (NIH): 133 mg/dL — ABNORMAL HIGH (ref 0–99)
Triglycerides: 175 mg/dL — ABNORMAL HIGH (ref 0–149)
VLDL Cholesterol Cal: 31 mg/dL (ref 5–40)

## 2023-12-10 LAB — TSH: TSH: 2.28 u[IU]/mL (ref 0.450–4.500)

## 2023-12-10 MED ORDER — CLONAZEPAM 0.5 MG PO TABS: ORAL_TABLET | ORAL | 0 refills | Status: DC

## 2023-12-10 NOTE — Patient Instructions (Signed)
 I value your feedback and you entrusting [place] with your care. If you get a [facility] patient survey, I would appreciate you taking the time to let us know about your experience today. Thank you!  [facility] ([place])--[phone]

## 2023-12-12 LAB — CYTOLOGY - PAP
Comment: NEGATIVE
Diagnosis: UNDETERMINED — AB
High risk HPV: NEGATIVE

## 2023-12-15 ENCOUNTER — Ambulatory Visit: Payer: Self-pay | Admitting: Obstetrics and Gynecology

## 2023-12-20 ENCOUNTER — Encounter: Payer: Self-pay | Admitting: Nurse Practitioner

## 2023-12-24 ENCOUNTER — Ambulatory Visit: Payer: Self-pay | Admitting: Nurse Practitioner

## 2024-01-22 DIAGNOSIS — G4733 Obstructive sleep apnea (adult) (pediatric): Secondary | ICD-10-CM | POA: Diagnosis not present

## 2024-02-21 DIAGNOSIS — G4733 Obstructive sleep apnea (adult) (pediatric): Secondary | ICD-10-CM | POA: Diagnosis not present

## 2024-02-28 ENCOUNTER — Other Ambulatory Visit: Payer: Self-pay | Admitting: Obstetrics and Gynecology

## 2024-02-28 DIAGNOSIS — F419 Anxiety disorder, unspecified: Secondary | ICD-10-CM

## 2024-03-16 DIAGNOSIS — D2271 Melanocytic nevi of right lower limb, including hip: Secondary | ICD-10-CM | POA: Diagnosis not present

## 2024-03-16 DIAGNOSIS — D2261 Melanocytic nevi of right upper limb, including shoulder: Secondary | ICD-10-CM | POA: Diagnosis not present

## 2024-03-16 DIAGNOSIS — D2262 Melanocytic nevi of left upper limb, including shoulder: Secondary | ICD-10-CM | POA: Diagnosis not present

## 2024-03-16 DIAGNOSIS — D2272 Melanocytic nevi of left lower limb, including hip: Secondary | ICD-10-CM | POA: Diagnosis not present

## 2024-03-23 DIAGNOSIS — G4733 Obstructive sleep apnea (adult) (pediatric): Secondary | ICD-10-CM | POA: Diagnosis not present

## 2024-04-23 DIAGNOSIS — G4733 Obstructive sleep apnea (adult) (pediatric): Secondary | ICD-10-CM | POA: Diagnosis not present

## 2024-04-27 ENCOUNTER — Ambulatory Visit (INDEPENDENT_AMBULATORY_CARE_PROVIDER_SITE_OTHER): Admitting: Nurse Practitioner

## 2024-04-27 ENCOUNTER — Encounter: Payer: Self-pay | Admitting: Nurse Practitioner

## 2024-04-27 VITALS — BP 121/83 | HR 80 | Wt 178.0 lb

## 2024-04-27 DIAGNOSIS — I10 Essential (primary) hypertension: Secondary | ICD-10-CM | POA: Diagnosis not present

## 2024-04-27 DIAGNOSIS — G4733 Obstructive sleep apnea (adult) (pediatric): Secondary | ICD-10-CM | POA: Insufficient documentation

## 2024-04-27 MED ORDER — LISINOPRIL 20 MG PO TABS: 20.0000 mg | ORAL_TABLET | Freq: Every day | ORAL | 1 refills | Status: AC

## 2024-04-27 MED ORDER — HYDROCHLOROTHIAZIDE 12.5 MG PO TABS: 12.5000 mg | ORAL_TABLET | Freq: Every day | ORAL | 1 refills | Status: AC

## 2024-04-27 NOTE — Progress Notes (Signed)
 BP 121/83   Pulse 80   Wt 178 lb (80.7 kg)   SpO2 95%   BMI 29.62 kg/m    Subjective:    Patient ID: Jenna James, female    DOB: 1971/01/30, 53 y.o.   MRN: [id number]  HPI: Jenna James is a 53 y.o. female  Chief Complaint  Patient presents with   Obstructive Sleep Apnea   SLEEP APNEA Patient is using her CPAP for 4-6 hours per night.  Sleep apnea status: better Duration: weeks Satisfied with current treatment?:  yes CPAP use:  yes Sleep quality with CPAP use: excellent Treament compliance:excellent compliance Last sleep study: May 2025 Treatments attempted:  Wakes feeling refreshed:  yes Daytime hypersomnolence:  no Fatigue:  no Insomnia:  no Good sleep hygiene:  yes Difficulty falling asleep:  no Difficulty staying asleep:  yes Snoring bothers bed partner:  no Observed apnea by bed partner: no Obesity:  no Hypertension: yes  Pulmonary hypertension:  no Coronary artery disease:  no  HYPERTENSION without Chronic Kidney Disease Hypertension status: controlled  Satisfied with current treatment? no Duration of hypertension: years BP monitoring frequency:  not checking BP range:  BP medication side effects:  no Medication compliance: excellent compliance Previous BP meds:HCTZ and lisinopril  Aspirin : no Recurrent headaches: no Visual changes: no Palpitations: no Dyspnea: no Chest pain: no Lower extremity edema: no Dizzy/lightheaded: no   Relevant past medical, surgical, family and social history reviewed and updated as indicated. Interim medical history since our last visit reviewed. Allergies and medications reviewed and updated.  Review of Systems  Eyes:  Negative for visual disturbance.  Respiratory:  Negative for cough, chest tightness and shortness of breath.   Cardiovascular:  Negative for chest pain, palpitations and leg swelling.  Neurological:  Negative for dizziness and headaches.    Per HPI unless specifically indicated above      Objective:    BP 121/83   Pulse 80   Wt 178 lb (80.7 kg)   SpO2 95%   BMI 29.62 kg/m   Wt Readings from Last 3 Encounters:  04/27/24 178 lb (80.7 kg)  12/10/23 175 lb (79.4 kg)  12/09/23 172 lb 9.6 oz (78.3 kg)    Physical Exam Vitals and nursing note reviewed.  Constitutional:      General: She is not in acute distress.    Appearance: Normal appearance. She is normal weight. She is not ill-appearing, toxic-appearing or diaphoretic.  HENT:     Head: Normocephalic.     Right Ear: External ear normal.     Left Ear: External ear normal.     Nose: Nose normal.     Mouth/Throat:     Mouth: Mucous membranes are moist.     Pharynx: Oropharynx is clear.  Eyes:     General:        Right eye: No discharge.        Left eye: No discharge.     Extraocular Movements: Extraocular movements intact.     Conjunctiva/sclera: Conjunctivae normal.     Pupils: Pupils are equal, round, and reactive to light.  Cardiovascular:     Rate and Rhythm: Normal rate and regular rhythm.     Heart sounds: No murmur heard. Pulmonary:     Effort: Pulmonary effort is normal. No respiratory distress.     Breath sounds: Normal breath sounds. No wheezing or rales.  Musculoskeletal:     Cervical back: Normal range of motion and neck supple.  Skin:    General:  Skin is warm and dry.     Capillary Refill: Capillary refill takes less than 2 seconds.  Neurological:     General: No focal deficit present.     Mental Status: She is alert and oriented to person, place, and time. Mental status is at baseline.  Psychiatric:        Mood and Affect: Mood normal.        Behavior: Behavior normal.        Thought Content: Thought content normal.        Judgment: Judgment normal.     Results for orders placed or performed in visit on 12/10/23  Cytology - PAP   Collection Time: 12/10/23 11:15 AM  Result Value Ref Range   High risk HPV Negative    Adequacy      Satisfactory for evaluation; transformation zone  component PRESENT.   Diagnosis (A)     - Atypical squamous cells of undetermined significance (ASC-US )   Comment Normal Reference Range HPV - Negative       Assessment & Plan:   Problem List Items Addressed This Visit       Cardiovascular and Mediastinum   Primary hypertension - Primary   Chronic.  Controlled.  Continue with current medication regimen of Lisinopril  and HCTZ.  Labs ordered today.  Return to clinic in 8 months for reevaluation.  Call sooner if concerns arise.        Relevant Medications   hydrochlorothiazide  (HYDRODIURIL ) 12.5 MG tablet   lisinopril  (ZESTRIL ) 20 MG tablet   Other Relevant Orders   Comp Met (CMET)     Respiratory   OSA (obstructive sleep apnea)   Doing well with CPAP. Per her app she has 93% compliance. Continue with current therapy.        Follow up plan: Return in about 8 months (around 12/25/2024) for Physical and Fasting labs.

## 2024-04-27 NOTE — Assessment & Plan Note (Signed)
 Chronic.  Controlled.  Continue with current medication regimen of Lisinopril  and HCTZ.  Labs ordered today.  Return to clinic in 8 months for reevaluation.  Call sooner if concerns arise.

## 2024-04-27 NOTE — Assessment & Plan Note (Signed)
 Doing well with CPAP. Per her app she has 93% compliance. Continue with current therapy.

## 2024-04-28 ENCOUNTER — Ambulatory Visit: Payer: Self-pay | Admitting: Nurse Practitioner

## 2024-04-28 DIAGNOSIS — R7309 Other abnormal glucose: Secondary | ICD-10-CM

## 2024-04-28 LAB — COMPREHENSIVE METABOLIC PANEL WITH GFR
ALT: 21 IU/L (ref 0–32)
AST: 19 IU/L (ref 0–40)
Albumin: 4.4 g/dL (ref 3.8–4.9)
Alkaline Phosphatase: 79 IU/L (ref 49–135)
BUN/Creatinine Ratio: 11 (ref 9–23)
BUN: 9 mg/dL (ref 6–24)
Bilirubin Total: 0.4 mg/dL (ref 0.0–1.2)
CO2: 22 mmol/L (ref 20–29)
Calcium: 9.7 mg/dL (ref 8.7–10.2)
Chloride: 103 mmol/L (ref 96–106)
Creatinine, Ser: 0.82 mg/dL (ref 0.57–1.00)
Globulin, Total: 2.5 g/dL (ref 1.5–4.5)
Glucose: 137 mg/dL — ABNORMAL HIGH (ref 70–99)
Potassium: 3.7 mmol/L (ref 3.5–5.2)
Sodium: 139 mmol/L (ref 134–144)
Total Protein: 6.9 g/dL (ref 6.0–8.5)
eGFR: 84 mL/min/1.73 (ref >59)

## 2024-04-29 ENCOUNTER — Ambulatory Visit: Payer: Self-pay | Admitting: Nurse Practitioner

## 2024-04-29 ENCOUNTER — Other Ambulatory Visit

## 2024-04-29 DIAGNOSIS — R7309 Other abnormal glucose: Secondary | ICD-10-CM

## 2024-04-29 LAB — BAYER DCA HB A1C WAIVED: HB A1C (BAYER DCA - WAIVED): 5.3 % (ref 4.8–5.6)

## 2024-05-25 ENCOUNTER — Other Ambulatory Visit: Payer: Self-pay | Admitting: Obstetrics and Gynecology

## 2024-05-25 DIAGNOSIS — F419 Anxiety disorder, unspecified: Secondary | ICD-10-CM

## 2024-05-26 ENCOUNTER — Encounter: Payer: Self-pay | Admitting: Obstetrics and Gynecology

## 2024-05-26 ENCOUNTER — Other Ambulatory Visit: Payer: Self-pay | Admitting: Obstetrics and Gynecology

## 2024-05-26 DIAGNOSIS — F419 Anxiety disorder, unspecified: Secondary | ICD-10-CM

## 2024-05-26 MED ORDER — CLONAZEPAM 0.5 MG PO TABS: ORAL_TABLET | ORAL | 1 refills | Status: AC

## 2024-05-26 NOTE — Progress Notes (Signed)
Rx RF clonazepam to take sparingly

## 2024-06-12 ENCOUNTER — Ambulatory Visit: Admitting: Nurse Practitioner

## 2024-09-22 ENCOUNTER — Ambulatory Visit

## 2024-12-28 ENCOUNTER — Encounter: Admitting: Nurse Practitioner
